# Patient Record
Sex: Female | Born: 1990 | Race: Asian | Hispanic: No | Marital: Single | Smoking: Never smoker
Health system: Southern US, Community
[De-identification: ages and names within clinical notes are randomized; demographics above are authoritative.]

## PROBLEM LIST (undated history)

## (undated) DIAGNOSIS — Z789 Other specified health status: Secondary | ICD-10-CM

## (undated) DIAGNOSIS — M2142 Flat foot [pes planus] (acquired), left foot: Secondary | ICD-10-CM

## (undated) DIAGNOSIS — M2141 Flat foot [pes planus] (acquired), right foot: Secondary | ICD-10-CM

## (undated) DIAGNOSIS — I493 Ventricular premature depolarization: Secondary | ICD-10-CM

## (undated) HISTORY — PX: NO PAST SURGERIES: SHX2092

## (undated) HISTORY — DX: Flat foot (pes planus) (acquired), right foot: M21.41

## (undated) HISTORY — DX: Flat foot (pes planus) (acquired), left foot: M21.42

---
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
## 2012-07-27 ENCOUNTER — Ambulatory Visit (INDEPENDENT_AMBULATORY_CARE_PROVIDER_SITE_OTHER): Payer: BC Managed Care – PPO | Admitting: Physician Assistant

## 2012-07-27 VITALS — BP 114/79 | HR 45 | Temp 98.1°F | Resp 16 | Ht 62.5 in | Wt 127.0 lb

## 2012-07-27 DIAGNOSIS — S86899A Other injury of other muscle(s) and tendon(s) at lower leg level, unspecified leg, initial encounter: Secondary | ICD-10-CM

## 2012-07-27 DIAGNOSIS — M2140 Flat foot [pes planus] (acquired), unspecified foot: Secondary | ICD-10-CM

## 2012-07-27 DIAGNOSIS — IMO0002 Reserved for concepts with insufficient information to code with codable children: Secondary | ICD-10-CM

## 2012-07-27 DIAGNOSIS — M25579 Pain in unspecified ankle and joints of unspecified foot: Secondary | ICD-10-CM

## 2012-07-27 MED ORDER — IBUPROFEN 600 MG PO TABS: 600.0000 mg | ORAL_TABLET | Freq: Three times a day (TID) | ORAL | Status: DC | PRN

## 2012-07-27 NOTE — Progress Notes (Signed)
   [address] [place] [address]   Phone [phone]   Subjective:    Patient ID: Anna Grant, female    DOB: 21-Jul-1990, 21 y.o.   MRN: [id number]  HPI Pt presents to clinic with 2 concerns 1- shin pain for the last 4-5 days - she works out regularly - plays volleyball and runs at [employer]  She has not changed shoes or had an injury recently.  She put some ice on her shins and it did not help much - she is not using any meds. 2- feet pain - for years.  The bottoms of her feet hurt in the arches when she walks or stands for long periods of time.  She sits mostly at work.  Her feet do not hurt when she wears heels only flat shoes.  Review of Systems  Musculoskeletal: Negative for gait problem.       Objective:   Physical Exam  Vitals reviewed. Constitutional: She is oriented to person, place, and time. She appears well-developed and well-nourished.  HENT:  Head: Normocephalic and atraumatic.  Right Ear: External ear normal.  Left Ear: External ear normal.  Eyes: Conjunctivae are normal.  Pulmonary/Chest: Effort normal.  Musculoskeletal:  No TTP of shins but patient has pain with dorsiflexion.  Pes planus B no TTP of arches.  No TTP over plantar fascia.    Neurological: She is alert and oriented to person, place, and time.  Skin: Skin is warm and dry.  Psychiatric: She has a normal mood and affect. Her behavior is normal. Judgment and thought content normal.       Assessment & Plan:  Shin splints, initial encounter - Plan: ibuprofen (ADVIL,MOTRIN) 600 MG tablet  Pain in joint, ankle and foot, unspecified laterality -   Flat feet, unspecified laterality - pt will get orthotics - I wonder if her shin splints are related to her pes planus - she will try pre-made orthotics and if her pain does not improve - we will refer for custom orthotics.  Benny Lennert PA-C 07/27/2012 5:03 PM

## 2012-07-27 NOTE — Patient Instructions (Signed)
-  Off N Running - to be fitted for orthotics -   Shin Splints Shin splints is a painful condition that is felt on the shinbone or in the muscles on either side of the bone (front of your lower leg). Shin splints happen when physical activities, such as sports or other demanding exercise, leads to inflammation of the muscles, tendons, and the thin layer that covers the shinbone.  CAUSES   Overuse of muscles.  Repetitive activities.  Flat feet or rigid arches. Activities that could contribute to shin splints include:  A sudden increase in exercise time.  Starting a new, demanding activity.  Running up hills or long distances.  Playing sports with sudden starts and stops.  A poor warm up.  Old or worn-out shoes. SYMPTOMS   Pain on the front of the leg.  Pain while exercising or at rest. DIAGNOSIS  Your caregiver will diagnose shin splints from a history of your symptoms and a physical exam. You may be observed as you walk or run. X-ray exams or further testing may be needed to rule out other problems, such as a stress fracture, which also causes lower leg pain. TREATMENT  Your caregiver may decide on the treatment based on your age, history, health, and how bad the pain is. Most cases of shin splints can be managed by one or more of the following:  Resting.  Reducing the length and intensity of your exercise.  Stopping the activity that causes shin pain.  Taking medicines to control the inflammation.  Icing, massaging, stretching, and strengthening the affected area.  Getting shoes with rigid heels, shock absorption, and a good arch support. HOME CARE INSTRUCTIONS   Resume activity steadily or as directed by your caregiver.  Restart your exercise sessions with non-weight-bearing exercises, such as cycling or swimming.  Stop running if the pain returns.  Warm up properly before exercising.  Run on a level and fairly firm surface.  Gradually change the intensity of  an exercise.  Limit increases in running distance by no more than 5 to 10% weekly. This means if you are running 5 miles, you can only increase your run by 1/2 a mile at a time.  Change your athletic shoes every 6 months, or every 350 to 450 miles. SEEK MEDICAL CARE IF:   Symptoms continue or worsen even after treatment.  The location, intensity, or type of pain changes over time. SEEK IMMEDIATE MEDICAL CARE IF:   You have severe pain.  You have trouble walking. MAKE SURE YOU:  Understand these instructions.  Will watch your condition.  Will get help right away if you are not doing well or get worse. Document Released: 03/12/2000 Document Revised: 06/07/2011 Document Reviewed: 08/30/2010 [facility] Patient Information 2013 [place], [place].

## 2012-10-05 ENCOUNTER — Ambulatory Visit (INDEPENDENT_AMBULATORY_CARE_PROVIDER_SITE_OTHER): Payer: BC Managed Care – PPO | Admitting: Internal Medicine

## 2012-10-05 ENCOUNTER — Encounter: Payer: Self-pay | Admitting: Internal Medicine

## 2012-10-05 ENCOUNTER — Ambulatory Visit: Payer: BC Managed Care – PPO

## 2012-10-05 VITALS — BP 110/72 | HR 70 | Temp 98.2°F | Resp 16 | Ht 62.5 in | Wt 130.0 lb

## 2012-10-05 DIAGNOSIS — S6991XA Unspecified injury of right wrist, hand and finger(s), initial encounter: Secondary | ICD-10-CM

## 2012-10-05 DIAGNOSIS — Z79899 Other long term (current) drug therapy: Secondary | ICD-10-CM

## 2012-10-05 DIAGNOSIS — S6980XA Other specified injuries of unspecified wrist, hand and finger(s), initial encounter: Secondary | ICD-10-CM

## 2012-10-05 DIAGNOSIS — Z8342 Family history of familial hypercholesterolemia: Secondary | ICD-10-CM

## 2012-10-05 DIAGNOSIS — Z8349 Family history of other endocrine, nutritional and metabolic diseases: Secondary | ICD-10-CM

## 2012-10-05 DIAGNOSIS — S6990XA Unspecified injury of unspecified wrist, hand and finger(s), initial encounter: Secondary | ICD-10-CM

## 2012-10-05 LAB — COMPREHENSIVE METABOLIC PANEL
Alkaline Phosphatase: 41 U/L (ref 39–117)
BUN: 16 mg/dL (ref 6–23)
Glucose, Bld: 96 mg/dL (ref 70–99)
Sodium: 139 mEq/L (ref 135–145)
Total Bilirubin: 0.5 mg/dL (ref 0.3–1.2)

## 2012-10-05 LAB — LIPID PANEL
HDL: 54 mg/dL (ref >39)
LDL Cholesterol: 87 mg/dL (ref 0–99)
Triglycerides: 147 mg/dL (ref <150)
VLDL: 29 mg/dL (ref 0–40)

## 2012-10-05 LAB — POCT CBC
Granulocyte percent: 64.2 %G (ref 37–80)
Hemoglobin: 13.8 g/dL (ref 12.2–16.2)
MCH, POC: 30.2 pg (ref 27–31.2)
POC MID %: 6.6 %M (ref 0–12)
Platelet Count, POC: 208 10*3/uL (ref 142–424)
RBC: 4.57 M/uL (ref 4.04–5.48)
WBC: 7.1 10*3/uL (ref 4.6–10.2)

## 2012-10-05 NOTE — Patient Instructions (Signed)
Hand Contusion A hand contusion is a deep bruise on your hand area. Contusions are the result of an injury that caused bleeding under the skin. The contusion may turn blue, purple, or yellow. Minor injuries will give you a painless contusion, but more severe contusions may stay painful and swollen for a few weeks. CAUSES  A contusion is usually caused by a blow, trauma, or direct force to an area of the body. SYMPTOMS   Swelling and redness of the injured area.  Discoloration of the injured area.  Tenderness and soreness of the injured area.  Pain. DIAGNOSIS  The diagnosis can be made by taking a history and performing a physical exam. An X-ray, CT scan, or MRI may be needed to determine if there were any associated injuries, such as broken bones (fractures). TREATMENT  Often, the best treatment for a hand contusion is resting, elevating, icing, and applying cold compresses to the injured area. Over-the-counter medicines may also be recommended for pain control. HOME CARE INSTRUCTIONS   Put ice on the injured area.  Put ice in a plastic bag.  Place a towel between your skin and the bag.  Leave the ice on for 15-20 minutes, 3-4 times a day.  Only take over-the-counter or prescription medicines as directed by your caregiver. Your caregiver may recommend avoiding anti-inflammatory medicines (aspirin, ibuprofen, and naproxen) for 48 hours because these medicines may increase bruising.  If told, use an elastic wrap as directed. This can help reduce swelling. You may remove the wrap for sleeping, showering, and bathing. If your fingers become numb, cold, or blue, take the wrap off and reapply it more loosely.  Elevate your hand with pillows to reduce swelling.  Avoid overusing your hand if it is painful. SEEK IMMEDIATE MEDICAL CARE IF:   You have increased redness, swelling, or pain in your hand.  Your swelling or pain is not relieved with medicines.  You have loss of feeling in  your hand or are unable to move your fingers.  Your hand turns cold or blue.  You have pain when you move your fingers.  Your hand becomes warm to the touch.  Your contusion does not improve in 2 days. MAKE SURE YOU:   Understand these instructions.  Will watch your condition.  Will get help right away if you are not doing well or get worse. Document Released: 09/04/2001 Document Revised: 12/08/2011 Document Reviewed: 09/06/2011 [facility] Patient Information 2014 [place], [place]. Wound Care Wound care helps prevent pain and infection.  You may need a tetanus shot if:  You cannot remember when you had your last tetanus shot.  You have never had a tetanus shot.  The injury broke your skin. If you need a tetanus shot and you choose not to have one, you may get tetanus. Sickness from tetanus can be serious. HOME CARE   Only take medicine as told by your doctor.  Clean the wound daily with mild soap and water.  Change any bandages (dressings) as told by your doctor.  Put medicated cream and a bandage on the wound as told by your doctor.  Change the bandage if it gets wet, dirty, or starts to smell.  Take showers. Do not take baths, swim, or do anything that puts your wound under water.  Rest and raise (elevate) the wound until the pain and puffiness (swelling) are better.  Keep all doctor visits as told. GET HELP RIGHT AWAY IF:   Yellowish-white fluid (pus) comes from the wound.  Medicine does not lessen your pain.  There is a red streak going away from the wound.  You have a fever. MAKE SURE YOU:   Understand these instructions.  Will watch your condition.  Will get help right away if you are not doing well or get worse. Document Released: 12/23/2007 Document Revised: 06/07/2011 Document Reviewed: 07/19/2010 [facility] Patient Information 2014 [place], [place].

## 2012-10-05 NOTE — Progress Notes (Signed)
  Subjective:    Patient ID: Anna Grant, female    DOB: 1990-05-31, 22 y.o.   MRN: [id number]  HPI Slammed own car door on her rightt thumb, does not want to remove her nail polish-refuses. Thumb bruised and bleeding from base of nail. Polish thick white acrylic. Has full rom of IPJ Worried about her health and requests lab work, agrees to do CPE later.  Review of Systems fhx of cholesterol    Objective:   Physical Exam  Musculoskeletal: She exhibits edema and tenderness.       Right hand: She exhibits decreased range of motion, tenderness, bony tenderness and swelling. She exhibits normal two-point discrimination. Normal sensation noted. Normal strength noted. She exhibits no finger abduction and no thumb/finger opposition.       Hands: Bleeding and eccymotic   [facility] reading (PRIMARY) by  Dr Perrin Maltese no fx seen         Assessment & Plan:  Crush thumb/No fx seen Worried about health-labs and cpe soon Application of thumb spica splint Reck 2d

## 2013-05-15 ENCOUNTER — Ambulatory Visit: Payer: Self-pay | Admitting: Physician Assistant

## 2013-05-15 VITALS — BP 110/54 | HR 56 | Temp 99.1°F | Resp 18 | Ht 63.0 in | Wt 132.0 lb

## 2013-05-15 DIAGNOSIS — N949 Unspecified condition associated with female genital organs and menstrual cycle: Secondary | ICD-10-CM

## 2013-05-15 DIAGNOSIS — N938 Other specified abnormal uterine and vaginal bleeding: Secondary | ICD-10-CM

## 2013-05-15 DIAGNOSIS — N926 Irregular menstruation, unspecified: Secondary | ICD-10-CM

## 2013-05-15 LAB — POCT URINE PREGNANCY: PREG TEST UR: POSITIVE

## 2013-05-15 NOTE — Progress Notes (Signed)
   Subjective:    Patient ID: Anna Grant, female    DOB: 12-21-1990, 23 y.o.   MRN: [id number]  HPI   Anna Grant is a pleasant 23 yr old female here with concern for pregnancy.  She had a positive home pregnancy test today.  LMP was early [place] - thinks 4th, 5th, or 6th.  Reports that her periods have always been irregular.  She was not using contraception. States she was not having intercourse prior to the end of January, about 4 wks ago.  She was not trying to conceive.  After she had the positive test this AM she began to feel some breast tenderness and mild lower abd cramping.  She denies abd pain.  She denies bleeding or spotting.  She is not planning to continue the pregnancy.  She would like to undergo medical ab as soon as possible.     Review of Systems  Constitutional: Negative for fever and chills.  HENT: Negative.   Respiratory: Negative.   Cardiovascular: Negative.   Gastrointestinal: Negative for nausea, vomiting and abdominal pain.  Genitourinary: Negative for vaginal bleeding.  Musculoskeletal: Negative.   Skin: Negative.        Objective:   Physical Exam  Vitals reviewed. Constitutional: She is oriented to person, place, and time. She appears well-developed and well-nourished. No distress.  HENT:  Head: Normocephalic and atraumatic.  Eyes: Conjunctivae are normal. No scleral icterus.  Cardiovascular: Regular rhythm and normal heart sounds.  Bradycardia present.   Pulmonary/Chest: Effort normal and breath sounds normal. She has no wheezes. She has no rales.  Abdominal: Soft. There is no tenderness.  Neurological: She is alert and oriented to person, place, and time.  Skin: Skin is warm and dry.  Psychiatric: She has a normal mood and affect. Her behavior is normal.   Results for orders placed in visit on 05/15/13  POCT URINE PREGNANCY      Result Value Ref Range   Preg Test, Ur Positive         Assessment & Plan:  Menstrual period late - Plan: POCT urine  pregnancy   Anna Grant is a 23 yr old female here with amenorrhea and +pregnancy test.  With irregular periods, difficult to determine dates.  Pt reports she was not having intercourse prior to 4 wks ago.  She does not wish to continue with the pregnancy, and  we discussed her options for this.  Pt would like to have a medical abortion as soon as possible.  I explained that unfortunately we are not able to provide that service here.  I have given her contact information for several clinics locally that do provide these services.  It is possible that some OB/GYN practices in the area provide this as well but I do not know for certain, which I have also explained to the patient.  Her questions were answered during the visit.  She is welcome to call if she has further questions.  We briefly discussed the importance of contraception going forward to avoid unwanted pregnancy.  Pt understands and agrees.   Anna Grant MHS, PA-C [facility] 2/17/20156:12 PM

## 2014-02-05 ENCOUNTER — Ambulatory Visit (INDEPENDENT_AMBULATORY_CARE_PROVIDER_SITE_OTHER): Payer: No Typology Code available for payment source | Admitting: Family Medicine

## 2014-02-05 VITALS — BP 110/70 | HR 64 | Temp 98.2°F | Resp 16 | Ht 62.5 in | Wt 138.6 lb

## 2014-02-05 DIAGNOSIS — Z114 Encounter for screening for human immunodeficiency virus [HIV]: Secondary | ICD-10-CM

## 2014-02-05 DIAGNOSIS — Z113 Encounter for screening for infections with a predominantly sexual mode of transmission: Secondary | ICD-10-CM

## 2014-02-05 DIAGNOSIS — Z13220 Encounter for screening for lipoid disorders: Secondary | ICD-10-CM

## 2014-02-05 DIAGNOSIS — Z0000 Encounter for general adult medical examination without abnormal findings: Secondary | ICD-10-CM

## 2014-02-05 DIAGNOSIS — Z23 Encounter for immunization: Secondary | ICD-10-CM

## 2014-02-05 DIAGNOSIS — Z131 Encounter for screening for diabetes mellitus: Secondary | ICD-10-CM

## 2014-02-05 DIAGNOSIS — Z1151 Encounter for screening for human papillomavirus (HPV): Secondary | ICD-10-CM

## 2014-02-05 DIAGNOSIS — Z1329 Encounter for screening for other suspected endocrine disorder: Secondary | ICD-10-CM

## 2014-02-05 LAB — POCT UA - MICROSCOPIC ONLY
BACTERIA, U MICROSCOPIC: NEGATIVE
CASTS, UR, LPF, POC: NEGATIVE
CRYSTALS, UR, HPF, POC: NEGATIVE
MUCUS UA: NEGATIVE
YEAST UA: NEGATIVE

## 2014-02-05 LAB — POCT URINALYSIS DIPSTICK
BILIRUBIN UA: NEGATIVE
Glucose, UA: NEGATIVE
KETONES UA: NEGATIVE
Nitrite, UA: NEGATIVE
PH UA: 7
PROTEIN UA: NEGATIVE
Spec Grav, UA: 1.02
Urobilinogen, UA: 0.2

## 2014-02-05 NOTE — Progress Notes (Signed)
IDENTIFYING INFORMATION  Anna Grant / DOB: [birth year] / MRN: [id number]  The patient does not have any chronic medical conditions.    SUBJECTIVE   Chief Complaint: Annual Exam   History of present illness: Anna Grant is a 23 y.o. year old female who presents for an annual physical. She is very concerned about a 20 lbs weight gain that began in February of this year, after a 15 lbs weight loss.  She reports feeling dysthymic roughly two days a weeks, and denies anhedonia. She is not sexually active at this time, and reports that she has not had sex in over a year. She would like her cholesterol checked today, and would like to be screened for diabetes.   She has a past medical history of Pes planus of both feet. The patient has a current medication list which includes the following prescription(s): ibuprofen.   Anna Grant has No Known Allergies. She reports that she has never smoked. She is a never smoker. She reports that she does not drink alcohol or use illicit drugs. She reports that she does not engage in sexual activity. The patient has no past surgeries.    Review of Systems  Constitutional: Negative.   Eyes: Negative.   Respiratory: Negative.   Cardiovascular: Negative.   Gastrointestinal: Negative.   Genitourinary: Negative.   Musculoskeletal: Positive for back pain and joint pain.  Skin: Negative.   Neurological: Positive for headaches. Negative for dizziness, tingling, tremors, sensory change, speech change, focal weakness, seizures and loss of consciousness.  Psychiatric/Behavioral: Positive for depression.     OBJECTIVE  Blood pressure 110/70, pulse 64, temperature 98.2 F (36.8 C), temperature source Oral, resp. rate 16, height 5' 2.5" (1.588 m), weight 138 lb 9.6 oz (62.869 kg), last menstrual period 02/01/2014, SpO2 100 %. The patient's body mass index is 24.93 kg/(m^2).   Physical Exam  Constitutional: She is oriented to person, place, and time and well-developed,  well-nourished, and in no distress.  HENT:  Head: Normocephalic.  Right Ear: External ear normal.  Left Ear: External ear normal.  Nose: Nose normal.  Mouth/Throat: Oropharynx is clear and moist. No oropharyngeal exudate.  Eyes: Conjunctivae and EOM are normal. Pupils are equal, round, and reactive to light. Right eye exhibits no discharge. Left eye exhibits no discharge. No scleral icterus.  Neck: Normal range of motion. Neck supple.  Cardiovascular: Normal rate, regular rhythm, normal heart sounds and intact distal pulses.   Pulmonary/Chest: Effort normal and breath sounds normal.  Abdominal: Soft. Bowel sounds are normal. She exhibits no distension and no mass. There is no tenderness. There is no rebound and no guarding.  Neurological: She is alert and oriented to person, place, and time. No cranial nerve deficit. Gait normal. Coordination normal.  Skin: Skin is warm and dry. She is not diaphoretic.  Psychiatric: Mood, memory, affect and judgment normal.     Results for orders placed or performed in visit on 02/05/14  POCT urinalysis dipstick  Result Value Ref Range   Color, UA yellow    Clarity, UA clear    Glucose, UA neg    Bilirubin, UA neg    Ketones, UA neg    Spec Grav, UA 1.020    Blood, UA tr-intact    pH, UA 7.0    Protein, UA neg    Urobilinogen, UA 0.2    Nitrite, UA neg    Leukocytes, UA moderate (2+)   POCT UA - Microscopic Only  Result Value Ref Range  WBC, Ur, HPF, POC 1-5    RBC, urine, microscopic 0-1    Bacteria, U Microscopic neg    Mucus, UA neg    Epithelial cells, urine per micros 0-4    Crystals, Ur, HPF, POC neg    Casts, Ur, LPF, POC neg    Yeast, UA neg     ASSESSMENT & PLAN  Anna Grant was seen today for annual exam.   Diagnoses and associated orders for this visit:  Physical exam  - CBC with Differential  - Comprehensive metabolic panel  - POCT urinalysis dipstick  - POCT UA - Microscopic Only  Thyroid disorder screen  - TSH  Diabetes  mellitus screening  - Hemoglobin A1c  Routine screening for STI (sexually transmitted infection)  - GC/Chlamydia Probe Amp  - Hepatitis C antibody  - Hepatitis B surface antibody  - Hepatitis B surface antigen  - RPR  Screening for HIV (human immunodeficiency virus)  - HIV antibody  Screening for HPV - Pap test with GC/Chlamydia   The patient was instructed to to call or comeback to clinic as needed, or should symptoms warrant.  Deliah BostonMichael Clark, MHS, PA-C  [facility]  02/05/2014 7:19 PM

## 2014-02-06 LAB — CBC WITH DIFFERENTIAL/PLATELET
BASOS PCT: 1 % (ref 0–1)
Basophils Absolute: 0.1 10*3/uL (ref 0.0–0.1)
Eosinophils Absolute: 0.1 10*3/uL (ref 0.0–0.7)
Eosinophils Relative: 2 % (ref 0–5)
HEMATOCRIT: 39.3 % (ref 36.0–46.0)
HEMOGLOBIN: 13.2 g/dL (ref 12.0–15.0)
LYMPHS ABS: 2.1 10*3/uL (ref 0.7–4.0)
LYMPHS PCT: 33 % (ref 12–46)
MCH: 30.4 pg (ref 26.0–34.0)
MCHC: 33.6 g/dL (ref 30.0–36.0)
MCV: 90.6 fL (ref 78.0–100.0)
MONO ABS: 0.6 10*3/uL (ref 0.1–1.0)
MONOS PCT: 9 % (ref 3–12)
NEUTROS ABS: 3.5 10*3/uL (ref 1.7–7.7)
NEUTROS PCT: 55 % (ref 43–77)
Platelets: 221 10*3/uL (ref 150–400)
RBC: 4.34 MIL/uL (ref 3.87–5.11)
RDW: 12.9 % (ref 11.5–15.5)
WBC: 6.3 10*3/uL (ref 4.0–10.5)

## 2014-02-06 LAB — LIPID PANEL
CHOL/HDL RATIO: 3.3 ratio
CHOLESTEROL: 182 mg/dL (ref 0–200)
HDL: 55 mg/dL (ref >39)
LDL Cholesterol: 107 mg/dL — ABNORMAL HIGH (ref 0–99)
TRIGLYCERIDES: 99 mg/dL (ref <150)
VLDL: 20 mg/dL (ref 0–40)

## 2014-02-06 LAB — COMPREHENSIVE METABOLIC PANEL
ALBUMIN: 4.6 g/dL (ref 3.5–5.2)
ALT: 28 U/L (ref 0–35)
AST: 20 U/L (ref 0–37)
Alkaline Phosphatase: 43 U/L (ref 39–117)
BILIRUBIN TOTAL: 0.4 mg/dL (ref 0.2–1.2)
BUN: 15 mg/dL (ref 6–23)
CO2: 26 meq/L (ref 19–32)
Calcium: 9.5 mg/dL (ref 8.4–10.5)
Chloride: 102 mEq/L (ref 96–112)
Creat: 0.68 mg/dL (ref 0.50–1.10)
GLUCOSE: 90 mg/dL (ref 70–99)
POTASSIUM: 4.3 meq/L (ref 3.5–5.3)
SODIUM: 137 meq/L (ref 135–145)
TOTAL PROTEIN: 7.6 g/dL (ref 6.0–8.3)

## 2014-02-06 LAB — HEMOGLOBIN A1C
Hgb A1c MFr Bld: 5.3 % (ref <5.7)
Mean Plasma Glucose: 105 mg/dL (ref <117)

## 2014-02-06 LAB — HIV ANTIBODY (ROUTINE TESTING W REFLEX): HIV: NONREACTIVE

## 2014-02-06 LAB — RPR

## 2014-02-06 LAB — HEPATITIS B SURFACE ANTIBODY, QUANTITATIVE: Hepatitis B-Post: 1.1 m[IU]/mL

## 2014-02-06 LAB — HEPATITIS B SURFACE ANTIGEN: Hepatitis B Surface Ag: NEGATIVE

## 2014-02-06 LAB — HEPATITIS C ANTIBODY: HCV Ab: NEGATIVE

## 2014-02-06 LAB — TSH: TSH: 3.964 u[IU]/mL (ref 0.350–4.500)

## 2014-02-06 NOTE — Progress Notes (Signed)
D/w Mr Coralie Common. Agree with A/P. Dr Conley Rolls

## 2014-02-07 LAB — PAP IG, CT-NG, RFX HPV ASCU
CHLAMYDIA PROBE AMP: NEGATIVE
GC PROBE AMP: NEGATIVE

## 2014-02-08 ENCOUNTER — Encounter: Payer: Self-pay | Admitting: Physician Assistant

## 2014-11-04 ENCOUNTER — Telehealth: Payer: Self-pay

## 2014-11-04 NOTE — Telephone Encounter (Signed)
Patient called in MR voicemail on Friday 11/01/14 at 4:16pm requesting her immunization records for school, patient will need to fill out an ROI before we can process this. Patients call back number is [phone].

## 2014-11-06 NOTE — Telephone Encounter (Signed)
Called patient and left message on phone telling her that she will need to fill out ROI before we can process this request, let her know she could come in and fill one out or get it offline and fax it in if that was easier. Will wait for ROI before processing.

## 2014-11-14 NOTE — Telephone Encounter (Signed)
Did not see a ROI form for this patient as of today. Not sure if request was processed because encounter has been closed.

## 2015-03-07 IMAGING — CR DG FINGER THUMB 2+V*R*
1 series · 1 of 1 positions shown · non-contrast
Comparison: None.

CLINICAL DATA: Thumb injury

RIGHT THUMB 2+V

[PA]
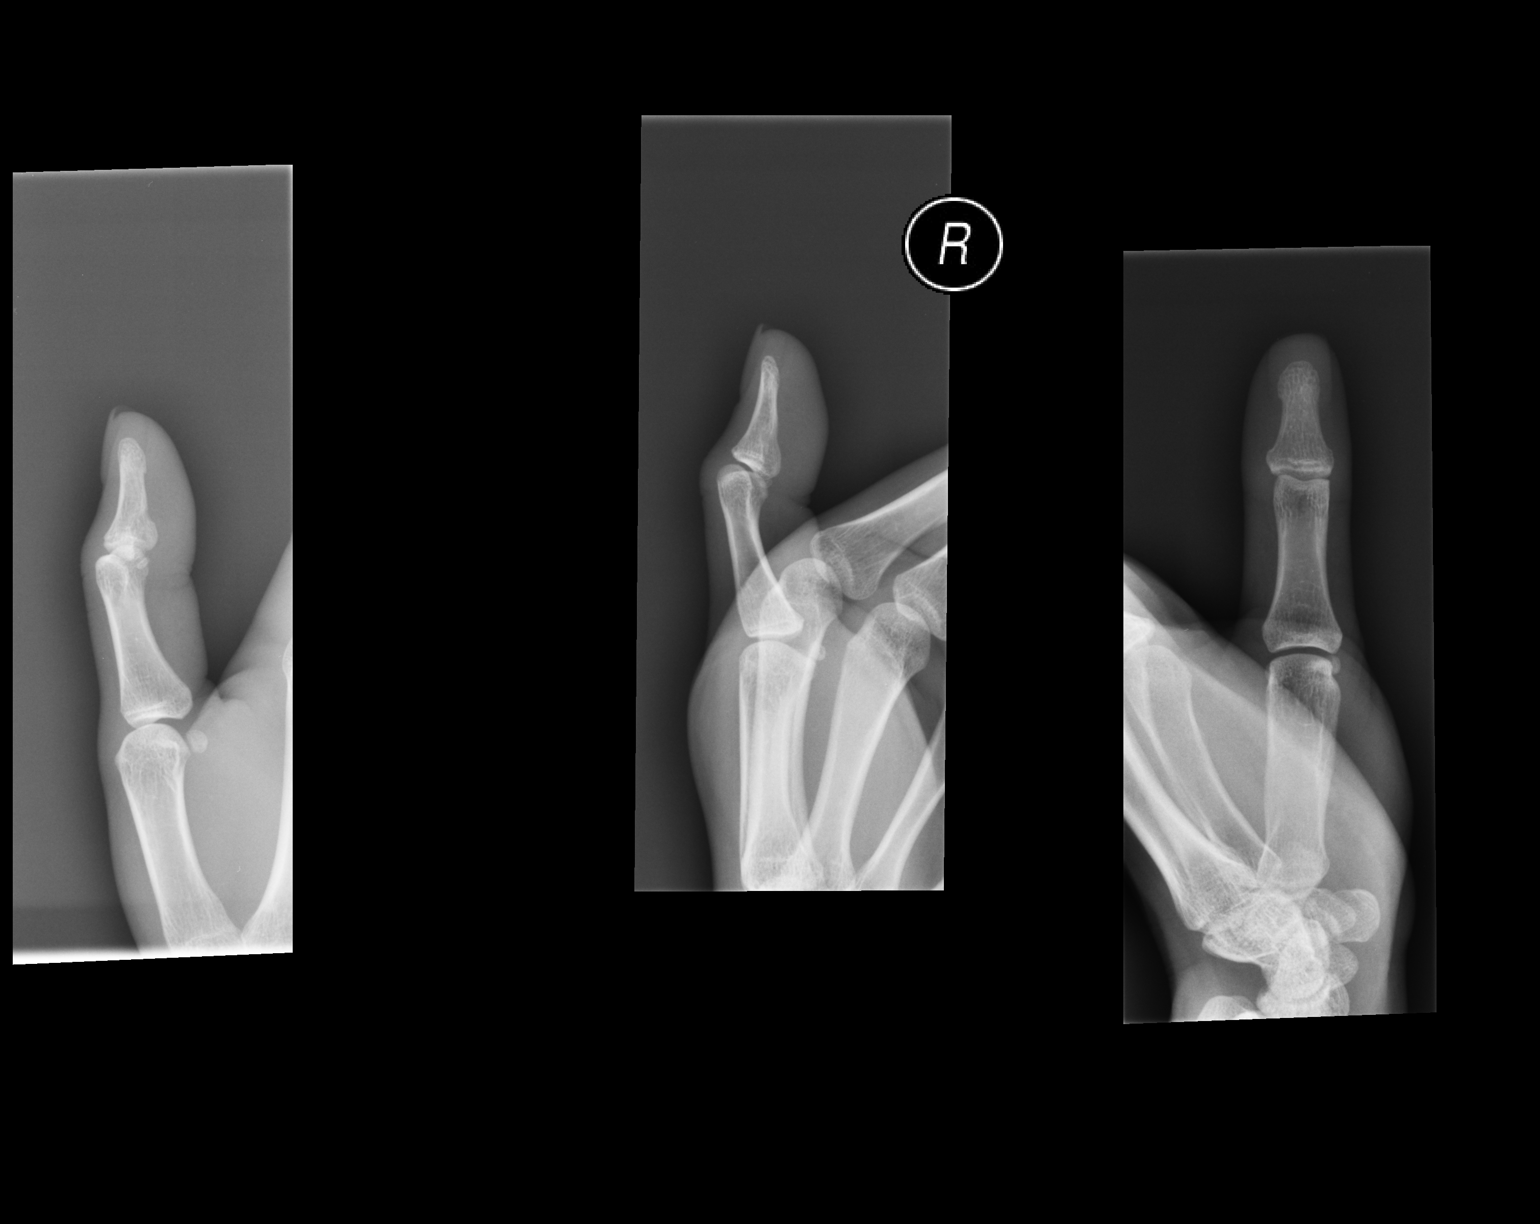

[1 of 1 positions shown; findings below may reference images not displayed]

FINDINGS: No fracture or dislocation is seen.

The joint spaces are preserved.

The visualized soft tissues are unremarkable.
IMPRESSION: No fracture or dislocation is seen.

Clinically significant discrepancy from primary report, if
provided: None

## 2016-02-12 ENCOUNTER — Ambulatory Visit (INDEPENDENT_AMBULATORY_CARE_PROVIDER_SITE_OTHER): Payer: BLUE CROSS/BLUE SHIELD | Admitting: Physician Assistant

## 2016-02-12 VITALS — BP 108/66 | HR 73 | Temp 98.2°F | Resp 17 | Ht 62.5 in | Wt 109.0 lb

## 2016-02-12 DIAGNOSIS — R946 Abnormal results of thyroid function studies: Secondary | ICD-10-CM

## 2016-02-12 DIAGNOSIS — R350 Frequency of micturition: Secondary | ICD-10-CM

## 2016-02-12 DIAGNOSIS — R7989 Other specified abnormal findings of blood chemistry: Secondary | ICD-10-CM

## 2016-02-12 DIAGNOSIS — Z3A01 Less than 8 weeks gestation of pregnancy: Secondary | ICD-10-CM | POA: Diagnosis not present

## 2016-02-12 LAB — POCT URINALYSIS DIP (MANUAL ENTRY)
GLUCOSE UA: NEGATIVE
Leukocytes, UA: NEGATIVE
NITRITE UA: NEGATIVE
Protein Ur, POC: NEGATIVE
Spec Grav, UA: 1.025
UROBILINOGEN UA: 0.2
pH, UA: 5.5

## 2016-02-12 LAB — TSH: TSH: 2.51 mIU/L

## 2016-02-12 LAB — POC MICROSCOPIC URINALYSIS (UMFC): MUCUS RE: ABSENT

## 2016-02-12 LAB — POCT URINE PREGNANCY: Preg Test, Ur: POSITIVE — AB

## 2016-02-12 LAB — T4, FREE: Free T4: 1.4 ng/dL (ref 0.8–1.8)

## 2016-02-12 NOTE — Progress Notes (Signed)
   Anna Grant  MRN: [id number] DOB: 10/13/1990  Subjective:  Pt presents to clinic with lower abd cramping for about 2 weeks - it is worse at night and in the am - Increased urinary frequency urgency but no dysuria.  No more vaginal discharge than normal.  She does not typically have cramping with menses or with ovulation -this is a different type of pain.  She has never had an ovarian cyst that she knows of.  LMP - 10/23    Review of Systems  Constitutional: Negative for chills and fever.  Gastrointestinal: Positive for abdominal pain. Negative for nausea and vomiting.  Genitourinary: Positive for frequency, urgency and vaginal discharge. Negative for dysuria, hematuria, menstrual problem and vaginal bleeding.    There are no active problems to display for this patient.   No current outpatient prescriptions on file prior to visit.   No current facility-administered medications on file prior to visit.     No Known Allergies  Pt patients past, family and social history were reviewed and updated.   Objective:  BP 108/66 (BP Location: Left Arm, Patient Position: Sitting, Cuff Size: Normal)   Pulse 73   Temp 98.2 F (36.8 C) (Oral)   Resp 17   Ht 5' 2.5" (1.588 m)   Wt 109 lb (49.4 kg)   LMP 01/19/2016 (Approximate)   SpO2 99%   BMI 19.62 kg/m   Physical Exam  Constitutional: She is oriented to person, place, and time and well-developed, well-nourished, and in no distress.  HENT:  Head: Normocephalic and atraumatic.  Right Ear: Hearing and external ear normal.  Left Ear: Hearing and external ear normal.  Eyes: Conjunctivae are normal.  Neck: Normal range of motion.  Cardiovascular: Normal rate, regular rhythm and normal heart sounds.   No murmur heard. Pulmonary/Chest: Effort normal and breath sounds normal. She has no wheezes.  Neurological: She is alert and oriented to person, place, and time. Gait normal.  Skin: Skin is warm and dry.  Psychiatric: Mood, memory,  affect and judgment normal.  Vitals reviewed.   Results for orders placed or performed in visit on 02/12/16  POCT urinalysis dipstick  Result Value Ref Range   Color, UA yellow yellow   Clarity, UA clear clear   Glucose, UA negative negative   Bilirubin, UA small (A) negative   Ketones, POC UA trace (5) (A) negative   Spec Grav, UA 1.025    Blood, UA large (A) negative   pH, UA 5.5    Protein Ur, POC negative negative   Urobilinogen, UA 0.2    Nitrite, UA Negative Negative   Leukocytes, UA Negative Negative  POCT Microscopic Urinalysis ([facility])  Result Value Ref Range   WBC,UR,HPF,POC Few (A) None WBC/hpf   RBC,UR,HPF,POC None None RBC/hpf   Bacteria Few (A) None, Too numerous to count   Mucus Absent Absent   Epithelial Cells, UR Per Microscopy Many (A) None, Too numerous to count cells/hpf  POCT urine pregnancy  Result Value Ref Range   Preg Test, Ur Positive (A) Negative    Assessment and Plan :  Urinary frequency - Plan: POCT urinalysis dipstick, POCT Microscopic Urinalysis ([facility]), POCT urine pregnancy  Abnormal TSH - Plan: TSH, T4, Free - she has been told this needs to be checked again -   Less than [redacted] weeks gestation of pregnancy - continue prenatal vitamins -    Benny Lennert PA-C  [facility] 02/12/2016 4:00 PM

## 2016-02-12 NOTE — Patient Instructions (Addendum)
3 weeks 9 days Due October 22 2016    IF you received an x-ray today, you will receive an invoice from [facility]. Please contact [facility] at [phone] with questions or concerns regarding your invoice.   IF you received labwork today, you will receive an invoice from [employer]. Please contact [facility] at [phone] with questions or concerns regarding your invoice.   Our billing staff will not be able to assist you with questions regarding bills from these companies.  You will be contacted with the lab results as soon as they are available. The fastest way to get your results is to activate your My Chart account. Instructions are located on the last page of this paperwork. If you have not heard from [place] regarding the results in 2 weeks, please contact this office.

## 2020-03-29 NOTE — L&D Delivery Note (Signed)
Delivery Note At 10:27 AM a viable and healthy female was delivered via Vaginal, Spontaneous (Presentation:   Occiput Anterior).  APGAR: 8, 9; weight pending .  Precipitous unmedicated delivery. Placenta status: Spontaneous, Intact.  Cord: 3 vessels with the following complications: None.  Cord pH: n/a  Anesthesia: None Episiotomy:  none Lacerations:  right periurethral; due to proximity to urethra, a straight cath was inserted and found to be intact and remained in place during repair Suture Repair:  4.0 Vicryl Est. Blood Loss (mL):  50  Mom to postpartum.  Baby to Couplet care / Skin to Skin.  Larry Knipp A Arvell Pulsifer 10/08/2020, 10:53 AM

## 2020-09-24 LAB — OB RESULTS CONSOLE GBS: GBS: NEGATIVE

## 2020-10-08 ENCOUNTER — Encounter (HOSPITAL_COMMUNITY): Payer: Self-pay | Admitting: *Deleted

## 2020-10-08 ENCOUNTER — Inpatient Hospital Stay (HOSPITAL_COMMUNITY)
Admission: AD | Admit: 2020-10-08 | Discharge: 2020-10-11 | DRG: 805 | Disposition: A | Discharge status: Home / Self Care | Payer: 59 | Attending: Obstetrics and Gynecology | Admitting: Obstetrics and Gynecology

## 2020-10-08 ENCOUNTER — Other Ambulatory Visit: Payer: Self-pay

## 2020-10-08 DIAGNOSIS — O9942 Diseases of the circulatory system complicating childbirth: Secondary | ICD-10-CM | POA: Diagnosis present

## 2020-10-08 DIAGNOSIS — I517 Cardiomegaly: Secondary | ICD-10-CM | POA: Diagnosis present

## 2020-10-08 DIAGNOSIS — O99892 Other specified diseases and conditions complicating childbirth: Secondary | ICD-10-CM | POA: Diagnosis present

## 2020-10-08 DIAGNOSIS — Z3A39 39 weeks gestation of pregnancy: Secondary | ICD-10-CM | POA: Diagnosis not present

## 2020-10-08 DIAGNOSIS — U071 COVID-19: Secondary | ICD-10-CM | POA: Diagnosis present

## 2020-10-08 DIAGNOSIS — I498 Other specified cardiac arrhythmias: Secondary | ICD-10-CM | POA: Diagnosis not present

## 2020-10-08 DIAGNOSIS — R9431 Abnormal electrocardiogram [ECG] [EKG]: Secondary | ICD-10-CM | POA: Diagnosis present

## 2020-10-08 DIAGNOSIS — O479 False labor, unspecified: Secondary | ICD-10-CM | POA: Diagnosis present

## 2020-10-08 DIAGNOSIS — O26893 Other specified pregnancy related conditions, third trimester: Secondary | ICD-10-CM | POA: Diagnosis present

## 2020-10-08 DIAGNOSIS — R001 Bradycardia, unspecified: Secondary | ICD-10-CM | POA: Diagnosis present

## 2020-10-08 DIAGNOSIS — O9902 Anemia complicating childbirth: Secondary | ICD-10-CM | POA: Diagnosis present

## 2020-10-08 DIAGNOSIS — O9852 Other viral diseases complicating childbirth: Secondary | ICD-10-CM | POA: Diagnosis present

## 2020-10-08 DIAGNOSIS — I493 Ventricular premature depolarization: Secondary | ICD-10-CM | POA: Diagnosis not present

## 2020-10-08 DIAGNOSIS — O1404 Mild to moderate pre-eclampsia, complicating childbirth: Secondary | ICD-10-CM | POA: Diagnosis present

## 2020-10-08 DIAGNOSIS — O139 Gestational [pregnancy-induced] hypertension without significant proteinuria, unspecified trimester: Secondary | ICD-10-CM | POA: Diagnosis present

## 2020-10-08 DIAGNOSIS — I428 Other cardiomyopathies: Secondary | ICD-10-CM | POA: Diagnosis not present

## 2020-10-08 HISTORY — DX: Ventricular premature depolarization: I49.3

## 2020-10-08 HISTORY — DX: Other specified health status: Z78.9

## 2020-10-08 LAB — COMPREHENSIVE METABOLIC PANEL
ALT: 30 U/L (ref 0–44)
AST: 41 U/L (ref 15–41)
Albumin: 2.8 g/dL — ABNORMAL LOW (ref 3.5–5.0)
Alkaline Phosphatase: 167 U/L — ABNORMAL HIGH (ref 38–126)
Anion gap: 9 (ref 5–15)
BUN: 11 mg/dL (ref 6–20)
CO2: 20 mmol/L — ABNORMAL LOW (ref 22–32)
Calcium: 8.4 mg/dL — ABNORMAL LOW (ref 8.9–10.3)
Chloride: 105 mmol/L (ref 98–111)
Creatinine, Ser: 0.74 mg/dL (ref 0.44–1.00)
GFR, Estimated: >60 mL/min (ref >60)
Glucose, Bld: 78 mg/dL (ref 70–99)
Potassium: 4.2 mmol/L (ref 3.5–5.1)
Sodium: 134 mmol/L — ABNORMAL LOW (ref 135–145)
Total Bilirubin: 0.5 mg/dL (ref 0.3–1.2)
Total Protein: 5.6 g/dL — ABNORMAL LOW (ref 6.5–8.1)

## 2020-10-08 LAB — RPR: RPR Ser Ql: NONREACTIVE

## 2020-10-08 LAB — TYPE AND SCREEN
ABO/RH(D): A POS
Antibody Screen: NEGATIVE

## 2020-10-08 LAB — CBC
HCT: 36.9 % (ref 36.0–46.0)
Hemoglobin: 12.1 g/dL (ref 12.0–15.0)
MCH: 30.6 pg (ref 26.0–34.0)
MCHC: 32.8 g/dL (ref 30.0–36.0)
MCV: 93.4 fL (ref 80.0–100.0)
Platelets: 138 10*3/uL — ABNORMAL LOW (ref 150–400)
RBC: 3.95 MIL/uL (ref 3.87–5.11)
RDW: 13.1 % (ref 11.5–15.5)
WBC: 10 10*3/uL (ref 4.0–10.5)
nRBC: 0 % (ref 0.0–0.2)

## 2020-10-08 LAB — OB RESULTS CONSOLE RUBELLA ANTIBODY, IGM: Rubella: IMMUNE

## 2020-10-08 LAB — OB RESULTS CONSOLE HEPATITIS B SURFACE ANTIGEN: Hepatitis B Surface Ag: NEGATIVE

## 2020-10-08 LAB — PROTEIN / CREATININE RATIO, URINE
Creatinine, Urine: 68.02 mg/dL
Protein Creatinine Ratio: 0.38 mg/mg{Cre} — ABNORMAL HIGH (ref 0.00–0.15)
Total Protein, Urine: 26 mg/dL

## 2020-10-08 LAB — RESP PANEL BY RT-PCR (FLU A&B, COVID) ARPGX2
Influenza A by PCR: NEGATIVE
Influenza B by PCR: NEGATIVE
SARS Coronavirus 2 by RT PCR: POSITIVE — AB

## 2020-10-08 LAB — OB RESULTS CONSOLE HIV ANTIBODY (ROUTINE TESTING): HIV: NONREACTIVE

## 2020-10-08 MED ORDER — EPHEDRINE 5 MG/ML INJ: 10.0000 mg | INTRAVENOUS | Status: DC | PRN

## 2020-10-08 MED ORDER — OXYTOCIN BOLUS FROM INFUSION
333.0000 mL | Freq: Once | INTRAVENOUS | Status: AC
  Administered 2020-10-08: 333 mL via INTRAVENOUS

## 2020-10-08 MED ORDER — ONDANSETRON HCL 4 MG/2ML IJ SOLN: 4.0000 mg | Freq: Four times a day (QID) | INTRAMUSCULAR | Status: DC | PRN

## 2020-10-08 MED ORDER — PHENYLEPHRINE 40 MCG/ML (10ML) SYRINGE FOR IV PUSH (FOR BLOOD PRESSURE SUPPORT): 80.0000 ug | PREFILLED_SYRINGE | INTRAVENOUS | Status: DC | PRN

## 2020-10-08 MED ORDER — PRENATAL MULTIVITAMIN CH
1.0000 | ORAL_TABLET | Freq: Every day | ORAL | Status: DC
  Administered 2020-10-09 – 2020-10-11 (×3): 1 via ORAL
  Filled 2020-10-08 (×3): qty 1

## 2020-10-08 MED ORDER — LABETALOL HCL 5 MG/ML IV SOLN: 80.0000 mg | INTRAVENOUS | Status: DC | PRN

## 2020-10-08 MED ORDER — LACTATED RINGERS IV SOLN
500.0000 mL | Freq: Once | INTRAVENOUS | Status: AC
  Administered 2020-10-08: 500 mL via INTRAVENOUS

## 2020-10-08 MED ORDER — LABETALOL HCL 5 MG/ML IV SOLN: 40.0000 mg | INTRAVENOUS | Status: DC | PRN

## 2020-10-08 MED ORDER — ACETAMINOPHEN 325 MG PO TABS
650.0000 mg | ORAL_TABLET | ORAL | Status: DC | PRN
  Administered 2020-10-08: 650 mg via ORAL
  Filled 2020-10-08: qty 2

## 2020-10-08 MED ORDER — LIDOCAINE HCL (PF) 1 % IJ SOLN
30.0000 mL | INTRAMUSCULAR | Status: AC | PRN
  Administered 2020-10-08: 30 mL via SUBCUTANEOUS
  Filled 2020-10-08: qty 30

## 2020-10-08 MED ORDER — DIBUCAINE (PERIANAL) 1 % EX OINT: 1.0000 "application " | TOPICAL_OINTMENT | CUTANEOUS | Status: DC | PRN

## 2020-10-08 MED ORDER — HYDRALAZINE HCL 20 MG/ML IJ SOLN: 10.0000 mg | Freq: Once | INTRAMUSCULAR | Status: DC

## 2020-10-08 MED ORDER — ONDANSETRON HCL 4 MG/2ML IJ SOLN: 4.0000 mg | INTRAMUSCULAR | Status: DC | PRN

## 2020-10-08 MED ORDER — IBUPROFEN 600 MG PO TABS
600.0000 mg | ORAL_TABLET | Freq: Four times a day (QID) | ORAL | Status: DC
  Administered 2020-10-08 – 2020-10-11 (×11): 600 mg via ORAL
  Filled 2020-10-08 (×12): qty 1

## 2020-10-08 MED ORDER — BENZOCAINE-MENTHOL 20-0.5 % EX AERO
1.0000 "application " | INHALATION_SPRAY | CUTANEOUS | Status: DC | PRN
  Administered 2020-10-09: 1 via TOPICAL
  Filled 2020-10-08: qty 56

## 2020-10-08 MED ORDER — SIMETHICONE 80 MG PO CHEW: 80.0000 mg | CHEWABLE_TABLET | ORAL | Status: DC | PRN

## 2020-10-08 MED ORDER — HYDRALAZINE HCL 50 MG PO TABS
50.0000 mg | ORAL_TABLET | Freq: Two times a day (BID) | ORAL | Status: DC
  Administered 2020-10-08 – 2020-10-11 (×6): 50 mg via ORAL
  Filled 2020-10-08 (×6): qty 1

## 2020-10-08 MED ORDER — LACTATED RINGERS IV SOLN: 500.0000 mL | INTRAVENOUS | Status: DC | PRN

## 2020-10-08 MED ORDER — COCONUT OIL OIL: 1.0000 "application " | TOPICAL_OIL | Status: DC | PRN

## 2020-10-08 MED ORDER — WITCH HAZEL-GLYCERIN EX PADS: 1.0000 "application " | MEDICATED_PAD | CUTANEOUS | Status: DC | PRN

## 2020-10-08 MED ORDER — SENNOSIDES-DOCUSATE SODIUM 8.6-50 MG PO TABS
2.0000 | ORAL_TABLET | Freq: Every day | ORAL | Status: DC
  Administered 2020-10-09 – 2020-10-11 (×3): 2 via ORAL
  Filled 2020-10-08 (×3): qty 2

## 2020-10-08 MED ORDER — OXYCODONE-ACETAMINOPHEN 5-325 MG PO TABS
1.0000 | ORAL_TABLET | ORAL | Status: DC | PRN
  Administered 2020-10-08 (×2): 1 via ORAL
  Filled 2020-10-08 (×2): qty 1

## 2020-10-08 MED ORDER — DIPHENHYDRAMINE HCL 25 MG PO CAPS
25.0000 mg | ORAL_CAPSULE | Freq: Four times a day (QID) | ORAL | Status: DC | PRN
Start: 2020-10-08 — End: 2020-10-11

## 2020-10-08 MED ORDER — DIPHENHYDRAMINE HCL 50 MG/ML IJ SOLN: 12.5000 mg | INTRAMUSCULAR | Status: DC | PRN

## 2020-10-08 MED ORDER — ONDANSETRON HCL 4 MG PO TABS: 4.0000 mg | ORAL_TABLET | ORAL | Status: DC | PRN

## 2020-10-08 MED ORDER — SOD CITRATE-CITRIC ACID 500-334 MG/5ML PO SOLN: 30.0000 mL | ORAL | Status: DC | PRN

## 2020-10-08 MED ORDER — OXYCODONE-ACETAMINOPHEN 5-325 MG PO TABS: 2.0000 | ORAL_TABLET | ORAL | Status: DC | PRN

## 2020-10-08 MED ORDER — LACTATED RINGERS IV SOLN: INTRAVENOUS | Status: DC

## 2020-10-08 MED ORDER — ACETAMINOPHEN 325 MG PO TABS: 650.0000 mg | ORAL_TABLET | ORAL | Status: DC | PRN

## 2020-10-08 MED ORDER — OXYTOCIN-SODIUM CHLORIDE 30-0.9 UT/500ML-% IV SOLN
2.5000 [IU]/h | INTRAVENOUS | Status: DC
  Administered 2020-10-08: 2.5 [IU]/h via INTRAVENOUS
  Filled 2020-10-08: qty 500

## 2020-10-08 MED ORDER — LABETALOL HCL 5 MG/ML IV SOLN: 20.0000 mg | INTRAVENOUS | Status: DC | PRN

## 2020-10-08 MED ORDER — FENTANYL-BUPIVACAINE-NACL 0.5-0.125-0.9 MG/250ML-% EP SOLN
12.0000 mL/h | EPIDURAL | Status: DC | PRN
  Filled 2020-10-08: qty 250

## 2020-10-08 MED ORDER — ZOLPIDEM TARTRATE 5 MG PO TABS: 5.0000 mg | ORAL_TABLET | Freq: Every evening | ORAL | Status: DC | PRN

## 2020-10-08 MED ORDER — HYDRALAZINE HCL 20 MG/ML IJ SOLN
10.0000 mg | INTRAMUSCULAR | Status: DC | PRN
  Administered 2020-10-08: 10 mg via INTRAVENOUS
  Filled 2020-10-08: qty 1

## 2020-10-08 MED ORDER — TETANUS-DIPHTH-ACELL PERTUSSIS 5-2.5-18.5 LF-MCG/0.5 IM SUSY
0.5000 mL | PREFILLED_SYRINGE | Freq: Once | INTRAMUSCULAR | Status: DC
  Filled 2020-10-08: qty 0.5

## 2020-10-08 NOTE — Progress Notes (Addendum)
Page to cardiology around 3 pm, 4 pm. Cardiology consult requested d/t patient's h/o ventricular bigeminy ('cardiology master' number) No f/u prior to pregnancy and pt did not have any follow up during pregnancy - pt had not admitted to cardiac history prior today Discussed pt s/p uncomplicated svd, covid test positive on admisson  Spoke with Dr Herbie [place]:  Pt reviewed, formal consult pending for am Reviewed bps and will begin hydralazine 50mg  po bid, begin now

## 2020-10-08 NOTE — Progress Notes (Signed)
Late Entry Note  After patient delivered, her vital signs were monitored and persistent bradycardia was noted. A stat EKG was obtained which revealed abnormal findings including ventricular bigeminy, left ventricular hypertrophy, and prolonged QT interval. Given patient's cardiac history and failure to follow up, decision was made to obtain Cardiology consultation for further recommendations. The AMION paging service was used to request consultation to the '[facility]' was placed initially at 11AM. After 1.5 hours of no response a second page was made at 1300. At this point, patient's labor and delivery recovery was complete. The decision was made to transfer her to [facility] to allow for Telemetry monitoring while awaiting Cardiology consultation for next steps. Patient remained asymptomatic throughout this time  Toy Baker 10/08/20 7:24 PM

## 2020-10-08 NOTE — MAU Note (Signed)
After call to pharmacist , Dr Conni Elliot notified that pharmacy states to begin with the Apresoline due to the fact that labetalol with slow heart rate

## 2020-10-08 NOTE — MAU Note (Signed)
Presents with ctxs every 5 minutes apart.  Denies LOF, reports has bloody mucous discharge.  Endorses +FM.

## 2020-10-08 NOTE — MAU Note (Signed)
Call to pharmacy to release meds

## 2020-10-08 NOTE — H&P (Signed)
Sonora Catlin is a 30 y.o. female G2P1 [redacted]w[redacted]d presenting for labor. Patient reports contractions started around 3AM, strong and regular in timing. No LOF or VB. Good FM. No other complaints, denies HA, vision changes, CP, SOB.  Patient was a late transfer of care to [facility] at 34 weeks. Had previously been receiving routine [facility] with [facility] clinic. Records received from their office showed:  Patient established [facility] at 17.6 planned desired pregnancy- dated by 2nd trimester [place]. Routine PNC since then. Normal anatomy US expecting a girl. Normal OB1 labs from 05/08/20: Blood type A POS, HepBsAg NEG, HIV NR, RPR NR, NG/CT NEG, Varicella Immune, Rubella Immune, normal hemoglobin electrophoresis, and low risk Quad screen. Normal 3rd tri labs 07/16/20: 1hr GTT 121 and H/H 12.1/36.9 platelets 152 and RPR NR. Received Tdap on 08/01/20. First pregnancy was a SVD at 40w with delivery of "almost 8 pound" baby boy uncomplicated per patient report. She had a follow up growth Korea in our office at 36 weeks, revealing Vertex presentation EFW 6#1 (33%) HC 30% AC 55% AFI 11.4cm ant plac, +FM and breathing. Has had a NEG GBS screen as well.  Patient reported no significant PMH/PSH at time of establishing [facility]. She has also remained normotensive with no prior history of elevated BP's. However upon presentation today patient is found to have elevated BP and bradycardia with pulse in the 40's. She is asymptomatic, denies any feelings of palpitations, CP, pressure, or SOB. When asked about any cardiac history she then reveals an episode in 2018 where she was hospitalized for 2 days, had extensive work up, and was told she had an arrhythmia. Patient is poor historian and unable to recall details. On chart review, there is an ER encounter from [place], [place] in 2017 where patient presented with CP/SOB and found to have ventricular bigeminy. She had an echocardiogram that showed normal LVEF of 59%, Per the outpatient  Cardiology note with [facility] 11/2015 no additional treatment required but recommended follow up in 1 year. Patient denies any additional follow up since that time.    OB History     Gravida  2   Para  1   Term      Preterm      AB      Living  1      SAB      IAB      Ectopic      Multiple      Live Births             Past Medical History:  Diagnosis Date   Medical history non-contributory    Pes planus of both feet    Past Surgical History:  Procedure Laterality Date   NO PAST SURGERIES     Family History: family history is not on file. Social History:  reports that she does not drink alcohol and does not use drugs. No history on file for tobacco use.     Maternal Diabetes: No Genetic Screening: Normal Maternal Ultrasounds/Referrals: Normal Fetal Ultrasounds or other Referrals:  None Maternal Substance Abuse:  No Significant Maternal Medications:  None Significant Maternal Lab Results:  Group B Strep negative Other Comments:  None  Review of Systems  All other systems reviewed and are negative. Per HPI Maternal Exam:  Uterine Assessment: Contraction strength is firm.  Contraction frequency is regular.  Abdomen: Patient reports no abdominal tenderness. Estimated fetal weight is 7.5#.   Fetal presentation: vertex Introitus: Normal vulva. Pelvis:  adequate for delivery.   Cervix: Cervix evaluated by digital exam.     Fetal Exam Fetal Monitor Review: Baseline rate: 115.  Variability: moderate (6-25 bpm).   Pattern: accelerations present and no decelerations.   Fetal State Assessment: Category I - tracings are normal.  Physical Exam Vitals reviewed.  Constitutional:      Appearance: Normal appearance.  HENT:     Head: Normocephalic.  Cardiovascular:     Rate and Rhythm: Bradycardia present.  Pulmonary:     Effort: Pulmonary effort is normal.  Genitourinary:    General: Normal vulva.  Skin:    General: Skin is warm and dry.   Neurological:     General: No focal deficit present.     Mental Status: She is alert and oriented to person, place, and time.  Psychiatric:        Mood and Affect: Mood normal.        Behavior: Behavior normal.    Dilation: 5 Effacement (%): 90 Station: -1 Exam by:: Senai Ramnath MD Blood pressure (!) 163/83, pulse (!) 46, temperature 98.1 F (36.7 C), temperature source Oral, resp. rate 18, height 5\' 3"  (1.6 m), weight 79.9 kg, SpO2 100 %.  Prenatal labs: ABO, Rh:  --/--/A POS (07/13 0815) Antibody: NEG (07/13 0815) Rubella:  IMMUNE RPR:   NR HBsAg:   NEG HIV:   NR GBS: Negative/-- (06/29 0000)    Assessment/Plan: Brie Eppard is a 30 y.o. female G2P1 [redacted]w[redacted]d admitted with active labor.  -Admission to LD for labor mgmt -Routine admission labs -Elevated BP, persistent severe range responded to 10mg  IV Hydralazine, no other symptoms of PEC, PIH labs added to admission labs. Likely pain contributor, will cont to monitor -Bradycardia with hx of ventricular bigeminy, will obtain EKG and consider telemetry if indicated -Pt requesting epidural, waiting on labs -GBS NEG -RH POS, Rubella Imm -Routine intrapartum care -Anticipate SVD  Leathie Weich A Trinnity Breunig 10/08/2020, 9:50 AM

## 2020-10-09 ENCOUNTER — Encounter (HOSPITAL_COMMUNITY): Payer: Self-pay | Admitting: Obstetrics and Gynecology

## 2020-10-09 DIAGNOSIS — I493 Ventricular premature depolarization: Secondary | ICD-10-CM

## 2020-10-09 LAB — CBC
HCT: 26.7 % — ABNORMAL LOW (ref 36.0–46.0)
Hemoglobin: 9 g/dL — ABNORMAL LOW (ref 12.0–15.0)
MCH: 31.8 pg (ref 26.0–34.0)
MCHC: 33.7 g/dL (ref 30.0–36.0)
MCV: 94.3 fL (ref 80.0–100.0)
Platelets: 124 10*3/uL — ABNORMAL LOW (ref 150–400)
RBC: 2.83 MIL/uL — ABNORMAL LOW (ref 3.87–5.11)
RDW: 13.5 % (ref 11.5–15.5)
WBC: 14.9 10*3/uL — ABNORMAL HIGH (ref 4.0–10.5)
nRBC: 0 % (ref 0.0–0.2)

## 2020-10-09 MED ORDER — OXYCODONE-ACETAMINOPHEN 5-325 MG PO TABS
1.0000 | ORAL_TABLET | Freq: Four times a day (QID) | ORAL | Status: DC | PRN
  Administered 2020-10-09 (×2): 1 via ORAL
  Filled 2020-10-09 (×2): qty 1

## 2020-10-09 NOTE — Consult Note (Addendum)
Cardiology Consultation:   Patient ID: Anna Grant MRN: [id number]; DOB: Jul 11, 1990  Admit date: 10/08/2020 Date of Consult: 10/09/2020  PCP:  Patient, No Pcp Per (Inactive)   [facility] Providers Cardiologist:  None       Patient Profile:   Anna Grant is a 30 y.o. female with a hx of PVCs and normal echo, stress test with no ischemia who is being seen 10/09/2020 for the evaluation of Bradycardia and PVCs at the request of Dr. Lanny Cramp.  History of Present Illness:   Anna Grant with prior hx of univocal ventricular ectopy in 2017 of 15-20% on hospital tele at that time and had normal ECHO and on nuc study exercised through stage III of Bruce protocol with HR of 160 and ventricular exercise suppressed with exercise.  In recovery PVCs noted including a couplet.  Some non specific ST -T wave changes noted in recovery.  No perfusion defects and EF 59%.  Seen with [place] cardiology   Pt now admitted 7/13/22to [facility] with labor.  G2P1, [id number].  On presentation her BP was elevated  167/91 and pulse was in the 40s, though asymptomatic.     She delivered 10/08/20 a female.  She did test + for COVID on admit.   EKG:  The EKG was personally reviewed and demonstrates:  SR at 34 with Bigemny PVCs.   Telemetry:  Telemetry was personally reviewed and demonstrates:  SR with big. PVCs and SR  Labs Na 134, k+ 4.2 alk phos 167 Hgb on admit 12.1 today 9.0 WBC 14.9 plts 124 down from 138.    BP 142/79 P 85 afebrile to 132/90 pt not aware of PVCs and was not aware in past.     Past Medical History:  Diagnosis Date   Medical history non-contributory    Pes planus of both feet    PVC (premature ventricular contraction)     Past Surgical History:  Procedure Laterality Date   NO PAST SURGERIES       Home Medications:  Prior to Admission medications   Medication Sig Start Date End Date Taking? Authorizing Provider  Prenatal Vit-Fe Fumarate-FA (PRENATAL MULTIVITAMIN) TABS tablet Take 1 tablet  by mouth daily at 12 noon.   Yes [provider]    Inpatient Medications: Scheduled Meds:  hydrALAZINE  50 mg Oral BID   ibuprofen  600 mg Oral Q6H   prenatal multivitamin  1 tablet Oral Q1200   senna-docusate  2 tablet Oral Daily   Tdap  0.5 mL Intramuscular Once   Continuous Infusions:  PRN Meds: acetaminophen, benzocaine-Menthol, coconut oil, witch hazel-glycerin **AND** dibucaine, diphenhydrAMINE, lidocaine (PF), ondansetron **OR** ondansetron (ZOFRAN) IV, oxyCODONE-acetaminophen, simethicone, zolpidem  Allergies:   No Known Allergies  Social History:   Social History   Socioeconomic History   Marital status: Single    Spouse name: Not on file   Number of children: Not on file   Years of education: Not on file   Highest education level: Not on file  Occupational History   Not on file  Tobacco Use   Smoking status: Never   Smokeless tobacco: Not on file  Substance and Sexual Activity   Alcohol use: No    Alcohol/week: 0.0 standard drinks   Drug use: No   Sexual activity: Never  Other Topics Concern   Not on file  Social History Narrative   Not on file   Social Determinants of Health   Financial Resource Strain: Not on file  Food Insecurity: Not  on file  Transportation Needs: Not on file  Physical Activity: Not on file  Stress: Not on file  Social Connections: Not on file  Intimate Partner Violence: Not on file    Family History:   History reviewed. No pertinent family history. No cardiac hx pertinent to this admit  ROS:  Please see the history of present illness.  General:no colds or fevers, + weight changes with preg. Skin:no rashes or ulcers HEENT:no blurred vision, no congestion CV:see HPI PUL:see HPI GI:no diarrhea constipation or melena, no indigestion GU:no hematuria, no dysuria MS:no joint pain, no claudication Neuro:no syncope, no lightheadedness Endo:no diabetes, no thyroid disease  All other ROS reviewed and negative.      Physical Exam/Data:   Vitals:   10/09/20 0840 10/09/20 1211 10/09/20 1517 10/09/20 1520  BP: (!) 142/79 132/90 (!) 154/87 (!) 145/83  Pulse: (!) 42 80 (!) 40 (!) 41  Resp: _0 Temp: 97.8 F (36.6 C) 98.2 F (36.8 C) 98.2 F (36.8 C)   TempSrc: Oral Oral Oral   SpO2: 100% 100% 100%   Weight:      Height:       No intake or output data in the 24 hours ending 10/09/20 1530 Last 3 Weights 10/08/2020 02/12/2016 02/05/2014  Weight (lbs) 176 lb 1.6 oz 109 lb 138 lb 9.6 oz  Weight (kg) 79.878 kg 49.442 kg 62.869 kg     Body mass index is 31.19 kg/m.   Exam: GEN: No acute distress.   Neck: No JVD Cardiac: Regularly irregular rate and pulse, no murmurs, rubs, or gallops.  Respiratory: Clear to auscultation bilaterally. GI: Soft, nontender, slight tenderness; examined with Daughter Anna Grant on the chest MS: No pitting edema; No deformity. Neuro:  Nonfocal  Psych: Normal affect   Relevant CV Studies: Echo 2017  Ref Range & Units 4 yr ago  IVS (2D) 0.6 - 0.9 cm 0.8   LVIDD (2D) 3.9 - 5.3 cm 4.4   LVIDS (2D) 2.2 - 3.5 cm 3.1   LVOT diameter cm 1.9   LVOT Mean Gradient mmHg 2.0   LVOT Vmax cm/s 85.0   LVOT peak gradient mmHg 3.0   LVPWd (2D) 0.6 - 0.9 cm 0.8   MV E/e' Lateral Ratio  4.7   MV E/e' Septal Ratio  7.1   LVOT area cm2 2.8   LA volume cm3 37.8   LA dimension (2D) cm 2.4   LA Volume Index 16 - 34 ml/m2 25.9   AV mean gradient mmHg 3.0   Ao Vmax cm/s 115.0   AV peak gradient mmHg 5.0   AVA (continuity VTI) cm2 2.1   AVA (continuity Vmax) cm2 2.1   Ao root diameter cm 2.3   Ascending aorta cm 2.6   MV deceleration time 160 - 200 ms 261.0 Abnormal    MV Peak A Vel cm/s 47.1   MV Peak E Vel cm/s 82.0   MV mean gradient mmHg 2.0   MV peak gradient mmHg 3.0   E/A ratio 0.5 - 1.5 1.7 Abnormal    Pulmonary artery acceleration time ms 148.0   PV Peak Velocity cm/s 91.9   PV peak gradient mmHg 3.0   RA R/L Diam cm 3.4   BP mmHg 92/56   BSA m2 1.45    Weight oz 1,696   Height (In) in 62   LA R/L Diam cm 3.7   RV Free Wall Peak S' cm/s 11.9   Pulmonary Artery Mean Presure mmHg  13.5   BP EF % 56   Weight lb 106   Resulting Agency  [facility]   Narrative Performed by [facility] This result has an attachment that is not available.   The left ventricular systolic function is normal (55-65%).   Normal left ventricular diastolic function.   Normal chamber sizes.   No significant valvular abnormalities.   Left Ventricle  Normal left ventricle cavity size. Normal left ventricle wall thickness. The systolic function is normal (55-65%). Normal left ventricular diastolic function.   Right Ventricle  Normal right ventricular cavity size. Normal right ventricular systolic function. RV tissue doppler velocity is 11.9 cm/s.   Left Atrium  The left atrium is normal in size.   Right Atrium  The right atrium is normal in size.   IVC/SVC  The Inferior Vena Cava is normal in size. The IVC shows >50% inspiratory collapse.   Mitral Valve  The mitral valve is normal. No mitral valve stenosis. No mitral valve regurgitation.   Tricuspid Valve  The tricuspid valve is normal. No tricuspid valve regurgitation. unable to assess RVSP.   Aortic Valve  The aortic valve is normal. No aortic valve stenosis. No aortic valve regurgitation.   Pulmonic Valve  The pulmonic valve was not well visualized.   Pericardium  There is no pericardial effusion present.   TTE Procedure Information  Images were obtained from the parasternal, apical and subcostal view(s).   Aorta  The aortic root is normal in size. Specimen Collected: -- Last Resulted: 12/11/15 13:45  Received From: [facility]  Result Received: 10/08/20 07:31   View Encounter    Stress test  9/132/17  This result has an attachment that is not available.   Protocol: Bruce   Resting ECG: NSR, NSST changes, PVCs   Exercise Capacity: excellent.   Hemodynamic Response: submaximal .    Symptoms: shortness of breath.   Stress ECG: non diagnostic.   LV Function: EF is 59%. LV function is normal. The wall motion is  normal.   Left ventricle cavity size is normal.   Left ventricular perfusion: normal.   Stress Findings  A stress test was performed following the Bruce protocol  The patient reached the end of the protocol.  The patient reported shortness of breath during the stress test. Onset of symptoms occurred at stage 4 of the protocol. The patient experienced no angina during the stress test.  Blood pressure demonstrated a normal response to stress. Hemodynamic response demonstrated submaximal response to stress. Overall, the patient's exercise capacity was excellent.  The Duke Treadmill Risk Score is 12. Low risk treadmill score (>5).    Laboratory Data:  High Sensitivity Troponin:  No results for input(s): TROPONINIHS in the last 720 hours.   Chemistry Recent Labs  Lab 10/08/20 0815  NA 134*  K 4.2  CL 105  CO2 20*  GLUCOSE 78  BUN 11  CREATININE 0.74  CALCIUM 8.4*  GFRNONAA >60  ANIONGAP 9    Recent Labs  Lab 10/08/20 0815  PROT 5.6*  ALBUMIN 2.8*  AST 41  ALT 30  ALKPHOS 167*  BILITOT 0.5   Hematology Recent Labs  Lab 10/08/20 0815 10/09/20 0506  WBC 10.0 14.9*  RBC 3.95 2.83*  HGB 12.1 9.0*  HCT 36.9 26.7*  MCV 93.4 94.3  MCH 30.6 31.8  MCHC 32.8 33.7  RDW 13.1 13.5  PLT 138* 124*   BNPNo results for input(s): BNP, PROBNP in the last 168 hours.  DDimer No results for input(s):  DDIMER in the last 168 hours.   Radiology/Studies:  No results found.   Assessment and Plan:   Reported sinus brady, unsure if PVCs not palpated that seemed to be slow pulse.  Continues with PVCs big. At times.  Did well with delivery.  Prior Bigeminy PVCs  with normal echo and normal stress myoview.  Labs stable with slightly low Hgb 9.0 post delivery.  May consider Echo to see if any changes. Will defer to MD.  S/p delivery of baby girl COVID 19  virus   Risk Assessment/Risk Scores:       - CARPREG II Score of 4 for arrhythmias (though none serious)    For questions or updates, please contact [place] Please consult [email or web address] for contact info under    Signed, Cecilie Kicks, NP  10/09/2020 3:30 PM   Personally seen and examined. Agree with APP above with the following comments: Briefly 30 yo G2 F with previous history of frequent PVCs seen by [facility] in 2017; had negative CTPE, Normal Echo, normal stress test.  Peri-delivery had notable PVC (bigeminy).  Incidentally found to have COVID-19. Patient notes that since she has been told she has these PVCs, she has been feeling short of breath and anxious, but cannot discern this from before she was told she was having these rhythms.  Had weight gain, SOB, and leg swelling in last trimester.  No PVCs with first pregnancy.  Exam in the body of the note is mine; irregular heart beats noted. Labs notable for stable K and creatinine Personally reviewed relevant tests; ECG shows SR with frequent PVCs in the pattern of bigeminy.  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 - outpatient, non live heart monitor could be considered. - if echo is normal, this should not preclude her anticipated discharge of 10/10/20   Rudean Haskell, MD [address] [place], [place] [address] [phone]  4:45 PM

## 2020-10-09 NOTE — Progress Notes (Signed)
C/o mild nasal congestion; no sob/cp, nml lochia, voiding w/o difficulty No f/c/s   Patient Vitals for the past 24 hrs:  BP Temp Temp src Pulse Resp SpO2  10/09/20 0840 (!) 142/79 97.8 F (36.6 C) Oral (!) 42 17 100 %  10/08/20 2307 (!) 148/58 98 F (36.7 C) Oral (!) 45 16 --  10/08/20 2043 (!) 151/77 98 F (36.7 C) Oral (!) 41 18 --  10/08/20 1613 (!) 156/85 98.4 F (36.9 C) Oral (!) 41 16 --  10/08/20 1353 116/81 -- -- (!) 43 16 --  10/08/20 1300 -- -- -- -- 16 --  10/08/20 1247 (!) 146/75 -- -- (!) 43 16 --  10/08/20 1232 118/81 -- -- (!) 52 17 --  10/08/20 1217 137/72 -- -- (!) 48 17 --  10/08/20 1202 (!) 146/82 -- -- -- 17 --  10/08/20 1147 (!) 148/77 -- -- (!) 40 17 --  10/08/20 1132 132/82 -- -- (!) 48 17 --  10/08/20 1126 -- -- -- -- -- 100 %  10/08/20 1121 -- -- -- -- -- 100 %  10/08/20 1117 (!) 148/82 -- -- -- 18 --  10/08/20 1102 (!) 131/112 -- -- (!) 44 16 --  10/08/20 1056 -- -- -- -- -- 100 %   A&ox3 Heart: irreg rhythm Ctab Abd: soft, nt, fundus firm and below umb LE: no edema, nt bilat  CBC Latest Ref Rng & Units 10/09/2020 10/08/2020 02/05/2014  WBC 4.0 - 10.5 K/uL 14.9(H) 10.0 6.3  Hemoglobin 12.0 - 15.0 g/dL 9.0(L) 12.1 13.2  Hematocrit 36.0 - 46.0 % 26.7(L) 36.9 39.3  Platelets 150 - 400 K/uL 124(L) 138(L) 221    A/P: ppd1 s/p svd Doing well pp 2.  H/o ventricular bigeminy-ekg yesterday: ventricular bigeminy, left ventricular hypertrophy, and prolonged QT interval - cardiology consulted, pending though aware and following pt; asymptomatic 3. New onset elevated bp with admission - labs wnl, pc ratio c/w pre-eclampsia; pt had severe range bp on admit however mild range only since given hydralazine on admission; mild pre-eclampsia, and will follow closely; hydralazine started last night to manage mild range bps, 50mg  bid per cards, 4. Mild acute anemia - asymptomatic, plan iron q day 5. Covid positive - mild sx

## 2020-10-10 ENCOUNTER — Inpatient Hospital Stay (HOSPITAL_COMMUNITY): Payer: 59

## 2020-10-10 DIAGNOSIS — I498 Other specified cardiac arrhythmias: Secondary | ICD-10-CM

## 2020-10-10 DIAGNOSIS — I428 Other cardiomyopathies: Secondary | ICD-10-CM

## 2020-10-10 LAB — ECHOCARDIOGRAM LIMITED
Height: 63 in
Weight: 2817.6 oz

## 2020-10-10 MED ORDER — LABETALOL HCL 100 MG PO TABS
100.0000 mg | ORAL_TABLET | Freq: Two times a day (BID) | ORAL | Status: DC
  Administered 2020-10-10: 100 mg via ORAL
  Filled 2020-10-10: qty 1

## 2020-10-10 MED ORDER — FUROSEMIDE 40 MG PO TABS
20.0000 mg | ORAL_TABLET | Freq: Once | ORAL | Status: AC
  Administered 2020-10-10: 20 mg via ORAL
  Filled 2020-10-10: qty 1

## 2020-10-10 MED ORDER — FUROSEMIDE 40 MG PO TABS: 20.0000 mg | ORAL_TABLET | Freq: Once | ORAL | Status: DC

## 2020-10-10 MED ORDER — METOPROLOL TARTRATE 25 MG PO TABS
25.0000 mg | ORAL_TABLET | Freq: Two times a day (BID) | ORAL | Status: DC
  Administered 2020-10-10 – 2020-10-11 (×2): 25 mg via ORAL
  Filled 2020-10-10 (×4): qty 1

## 2020-10-10 NOTE — Progress Notes (Signed)
Echocardiogram 2D Echocardiogram has been performed.  Warren Lacy Ghada Abbett RDCS 10/10/2020, 8:01 AM

## 2020-10-10 NOTE — Progress Notes (Signed)
Called Dr Amado Nash with the patients BP and MD prefers for patient to stay overnight to evaluate the effectiveness of the new BP medication. RN called to patient room and told her the POC was to keep her overnight to monitor BPs and possibly send home in the morning. Patient agreeable to plan.

## 2020-10-10 NOTE — Progress Notes (Signed)
PPD #2, SVD. Girl  Covid19 + with mild nasal congestion s/s, no fever Bigemini, Cardiology following, appreciated  S: Has mild nasal congestion; no SOB/ CP or palpitations.No HA/ RUQ pain.  Minimal lochia, voiding w/o difficulty, no perineal pain.  Bottle feeding but plans to initiate breast feeding after going home. Breast fed 1st kid for 1 mo, low supply and had to return to [facility]  Patient Vitals for the past 24 hrs:  BP Temp Temp src Pulse Resp SpO2  10/10/20 0825 (!) 141/79 98.1 F (36.7 C) Oral (!) 41 18 99 %  10/10/20 0325 (!) 154/81 98.2 F (36.8 C) Oral (!) 45 18 98 %  10/09/20 2342 -- 98 F (36.7 C) Oral (!) 44 18 97 %  10/09/20 2214 (!) 146/68 -- -- (!) 45 -- --  10/09/20 1939 -- 98.2 F (36.8 C) Oral -- 17 97 %  10/09/20 1520 (!) 145/83 -- -- (!) 41 -- --  10/09/20 1517 (!) 154/87 98.2 F (36.8 C) Oral (!) 40 17 100 %  10/09/20 1211 132/90 98.2 F (36.8 C) Oral 80 18 100 %    A&ox3. Unlabored speech EKG pulse 80s  Heart: irreg rhythm Ctab Abd: soft, nt, fundus firm and below umb LE: no edema, nt bilat  CBC Latest Ref Rng & Units 10/09/2020 10/08/2020 02/05/2014  WBC 4.0 - 10.5 K/uL 14.9(H) 10.0 6.3  Hemoglobin 12.0 - 15.0 g/dL 9.0(L) 12.1 13.2  Hematocrit 36.0 - 46.0 % 26.7(L) 36.9 39.3  Platelets 150 - 400 K/uL 124(L) 138(L) 221    A/P: PPD #2, SVD. Doing well pp 1) Ventricular bigeminy-ekg done. Echo this morning, result /Cariology recc pending. 2) Pre-eclampsia, mild range BPs after initial severe at admission. Hydralazine 50mg  q12hr and add Labetalol per Cardiology recommendation, will start at 100mg  po bid since EKG rate is 80.  3) Mild acute anemia - asymptomatic, plan iron q day 4) Covid positive - mild sx Discharge planning pending Echo and Cardio input if Echo abn and titrate BPs   ,MD

## 2020-10-10 NOTE — Progress Notes (Signed)
Cardiology Progress Note  Patient ID: Anna Grant MRN: [id number] DOB: Aug 14, 1990 Date of Encounter: 10/10/2020  Primary Cardiologist: None  Subjective   Chief Complaint: Shortness of breath  HPI: Still having a lot of PVCs.  1+ pitting edema.  Needs a little better diuresis.  ROS:  All other ROS reviewed and negative. Pertinent positives noted in the HPI.     Inpatient Medications  Scheduled Meds:  furosemide  20 mg Oral Once   hydrALAZINE  50 mg Oral BID   ibuprofen  600 mg Oral Q6H   labetalol  100 mg Oral BID   prenatal multivitamin  1 tablet Oral Q1200   senna-docusate  2 tablet Oral Daily   Tdap  0.5 mL Intramuscular Once   Continuous Infusions:  PRN Meds: acetaminophen, benzocaine-Menthol, coconut oil, witch hazel-glycerin **AND** dibucaine, diphenhydrAMINE, ondansetron **OR** ondansetron (ZOFRAN) IV, oxyCODONE-acetaminophen, simethicone, zolpidem   Vital Signs   Vitals:   10/09/20 2342 10/10/20 0325 10/10/20 0825 10/10/20 1056  BP:  (!) 154/81 (!) 141/79 121/81  Pulse: (!) 44 (!) 45 (!) 41 (!) 42  Resp: 18 18 18 17   Temp: 98 F (36.7 C) 98.2 F (36.8 C) 98.1 F (36.7 C)   TempSrc: Oral Oral Oral   SpO2: 97% 98% 99%   Weight:      Height:        Intake/Output Summary (Last 24 hours) at 10/10/2020 1137 Last data filed at 10/10/2020 0300 Gross per 24 hour  Intake 1220 ml  Output --  Net 1220 ml   Last 3 Weights 10/08/2020 02/12/2016 02/05/2014  Weight (lbs) 176 lb 1.6 oz 109 lb 138 lb 9.6 oz  Weight (kg) 79.878 kg 49.442 kg 62.869 kg      Telemetry  Overnight telemetry shows ventricular bigeminy, which I personally reviewed.   ECG  The most recent ECG shows ventricular bigeminy, which I personally reviewed.   Physical Exam   Vitals:   10/09/20 2342 10/10/20 0325 10/10/20 0825 10/10/20 1056  BP:  (!) 154/81 (!) 141/79 121/81  Pulse: (!) 44 (!) 45 (!) 41 (!) 42  Resp: 18 18 18 17   Temp: 98 F (36.7 C) 98.2 F (36.8 C) 98.1 F (36.7 C)    TempSrc: Oral Oral Oral   SpO2: 97% 98% 99%   Weight:      Height:        Intake/Output Summary (Last 24 hours) at 10/10/2020 1137 Last data filed at 10/10/2020 0300 Gross per 24 hour  Intake 1220 ml  Output --  Net 1220 ml    Last 3 Weights 10/08/2020 02/12/2016 02/05/2014  Weight (lbs) 176 lb 1.6 oz 109 lb 138 lb 9.6 oz  Weight (kg) 79.878 kg 49.442 kg 62.869 kg    Body mass index is 31.19 kg/m.  General: Well nourished, well developed, in no acute distress Head: Atraumatic, normal size  Eyes: PEERLA, EOMI  Neck: Supple, JVD 7 8 cm water Endocrine: No thryomegaly Cardiac: Normal S1, S2; RRR; no murmurs, rubs, or gallops Lungs: Clear to auscultation bilaterally, no wheezing, rhonchi or rales  Abd: Soft, nontender, no hepatomegaly  Ext: 1+ pitting edema  Musculoskeletal: No deformities, BUE and BLE strength normal and equal Skin: Warm and dry, no rashes   Neuro: Alert and oriented to person, place, time, and situation, CNII-XII grossly intact, no focal deficits  Psych: Normal mood and affect   Labs  High Sensitivity Troponin:  No results for input(s): TROPONINIHS in the last 720 hours.   Cardiac EnzymesNo  results for input(s): TROPONINI in the last 168 hours. No results for input(s): TROPIPOC in the last 168 hours.  Chemistry Recent Labs  Lab 10/08/20 0815  NA 134*  K 4.2  CL 105  CO2 20*  GLUCOSE 78  BUN 11  CREATININE 0.74  CALCIUM 8.4*  PROT 5.6*  ALBUMIN 2.8*  AST 41  ALT 30  ALKPHOS 167*  BILITOT 0.5  GFRNONAA >60  ANIONGAP 9    Hematology Recent Labs  Lab 10/08/20 0815 10/09/20 0506  WBC 10.0 14.9*  RBC 3.95 2.83*  HGB 12.1 9.0*  HCT 36.9 26.7*  MCV 93.4 94.3  MCH 30.6 31.8  MCHC 32.8 33.7  RDW 13.1 13.5  PLT 138* 124*   BNPNo results for input(s): BNP, PROBNP in the last 168 hours.  DDimer No results for input(s): DDIMER in the last 168 hours.   Radiology  No results found.  Cardiac Studies  TTE 10/10/2020  1. Left ventricular  ejection fraction, by estimation, is 50%. The left  ventricle has mildly decreased function. The left ventricle demonstrates  global hypokinesis. Difficult to quantify LV systolic function given  bigeminal PVCs. Left ventricular  diastolic parameters were normal.   2. Right ventricular systolic function is normal. The right ventricular  size is normal. Tricuspid regurgitation signal is inadequate for assessing  PA pressure.   3. The mitral valve is normal in structure. Mild mitral valve  regurgitation. No evidence of mitral stenosis.   4. The aortic valve is tricuspid.   5. The inferior vena cava is dilated in size with >50% respiratory  variability, suggesting right atrial pressure of 8 mmHg.   6. The patient showed persistent bigeminal PVCs. The   Patient Profile  Anna Grant is a 30 y.o. female with history of PVCs who was admitted on 10/08/2020 for labor and subsequently underwent delivery with a baby girl.  Cardiology was consulted for frequent PVCs.  Assessment & Plan   Ventricular bigeminy -Extensive work-up in 2017 showed normal LV function.  She had an echocardiogram at that time that was normal.  She had a stress test that was normal. -Admitted for labor and delivery.  Had a successful and uneventful delivery. -Remains with ventricular bigeminy. -She is on labetalol and hydralazine for hypertension. -Ejection fraction is 50%. -She seems a bit volume up.  We will give her 20 mg of p.o. Lasix as one-time dose if okay per her obstetrician. -Would also prefer her to be switched to metoprolol to tartrate or succinate if able.  If not would recommend to continue labetalol.  There is no urgency to put her on this.  Her EF is normal and she just needs to be followed in several months for possible PVC ablation.  If this will be an issue with breast-feeding would discharge her with her labetalol and hydralazine. -We will arrange hospital follow-up with Dr. Lenor Derrick for further  management.  2.  Bradycardia -She is not actually bradycardic.  Her actual rate is in the 80s.  Due to PVCs her pulse by palpation will be in the 40s.  3.  Hypertension -Labetalol and hydralazine.  Well-controlled.  Consider transition to metoprolol if able.  However if this will be present in breastmilk would recommend to avoid this until she has completed breast-feeding.  [facility] will sign off.   Medication Recommendations: As above Other recommendations (labs, testing, etc): None Follow up as an outpatient: We will arrange hospital follow-up in 6 to 8 weeks with Dr. Lenor Derrick  For questions or updates, please contact [facility] Please consult [email or web address] for contact info under   Time Spent with Patient: I have spent a total of 25 minutes with patient reviewing hospital notes, telemetry, EKGs, labs and examining the patient as well as establishing an assessment and plan that was discussed with the patient.  > 50% of time was spent in direct patient care.    Signed, Lenna Gilford. Flora Lipps, MD, [facility]  [facility]  10/10/2020 11:37 AM

## 2020-10-10 NOTE — Progress Notes (Signed)
Called Dr. Juliene Pina to update on holding labetalol due to HR being 42, although the EKG is showing 80s. Per Dr. Juliene Pina it is fine to administer Labetalol due to the cardiologist recommendation.  Dr. Juliene Pina will review the ECHO results.

## 2020-10-10 NOTE — Progress Notes (Addendum)
Appreciate Cardio/ Dr Marylene Buerger input.  Echo report noted.  Pt has not started breast feeding and doesn't intend to do it more than 1 mo. Will stop Labetalol and add Metoprolol 25 mg BID.  Also Lasix 20 mg x 1 dose today.  Will watch BPs today since change in medication and since BP <12 hr back was 150/80 Plan discharge tomorrow if no new concerns and 3-4 days fup with Ob Dr Conni Elliot in office for BP check  Cardiology outpatient f/up with Dr Izora Ribas    --Anna Evans MD

## 2020-10-10 NOTE — Progress Notes (Signed)
Dr. Amado Nash called to verify the schedule of the Metoprolol. Per MD, start at 10pm since the labetalol was given at 1300. Call MD with report on patient after 4pm VS to see if the patient may be discharged.

## 2020-10-11 MED ORDER — IBUPROFEN 600 MG PO TABS
600.0000 mg | ORAL_TABLET | Freq: Four times a day (QID) | ORAL | 0 refills | Status: AC
Start: 2020-10-11 — End: ?

## 2020-10-11 MED ORDER — FERROUS SULFATE 325 (65 FE) MG PO TABS: 325.0000 mg | ORAL_TABLET | Freq: Every day | ORAL | 1 refills | Status: DC

## 2020-10-11 MED ORDER — METOPROLOL TARTRATE 25 MG PO TABS: 25.0000 mg | ORAL_TABLET | Freq: Two times a day (BID) | ORAL | 1 refills | Status: AC

## 2020-10-11 MED ORDER — HYDRALAZINE HCL 50 MG PO TABS: 50.0000 mg | ORAL_TABLET | Freq: Two times a day (BID) | ORAL | 1 refills | Status: DC

## 2020-10-11 NOTE — Progress Notes (Addendum)
  No f/c/s, mild congestion No sob/cp; pain/cramping controlled; nml lochia bottlefeeding   Patient Vitals for the past 24 hrs:  BP Temp Temp src Pulse Resp SpO2  10/11/20 0809 129/77 98 F (36.7 C) Oral 80 17 100 %  10/11/20 0340 (!) 141/83 98.1 F (36.7 C) Oral 77 16 100 %  10/11/20 0010 124/63 98.9 F (37.2 C) Oral (!) 44 18 --  10/10/20 1952 (!) 143/73 98.2 F (36.8 C) Oral (!) 42 20 --  10/10/20 1835 -- -- -- -- -- 100 %  10/10/20 1832 136/70 -- -- (!) 58 20 --  10/10/20 1625 135/80 98 F (36.7 C) Oral 76 -- 100 %  10/10/20 1458 (!) 116/54 98.1 F (36.7 C) Oral (!) 45 17 100 %  10/10/20 1307 (!) 146/86 -- -- 84 18 100 %  10/10/20 1212 (!) 147/79 98 F (36.7 C) Oral (!) 43 17 100 %  10/10/20 1056 121/81 -- -- (!) 42 17 --   A&ox3 Heart:Irreg rhythm, ekg:70s-100 Ctab Abd: soft,nt, nd; fundus firm and below umb LE: +1-2 edema mid-shin and distal, NT   CBC Latest Ref Rng & Units 10/09/2020 10/08/2020 02/05/2014  WBC 4.0 - 10.5 K/uL 14.9(H) 10.0 6.3  Hemoglobin 12.0 - 15.0 g/dL 9.0(L) 12.1 13.2  Hematocrit 36.0 - 46.0 % 26.7(L) 36.9 39.3  Platelets 150 - 400 K/uL 124(L) 138(L) 221   A/P: ppd 3 s/p svd Doing well, d/c home today Mild pre-eclampsia: bps controlled on hydralazine 50mg  bid, also metoprolol 25mg  bid; plan bp f/u in 3-4 days with Dr now nml to low mild range Ventricular bigeminy - cardiology consulted and plans f/u 6-8 wk (Dr ); s/p echo, ef 50% Covid positive - mild congestion Mild acute anemia - iron daily RH pos RI

## 2020-10-11 NOTE — Progress Notes (Signed)
Discharge instructions given, all questions answered, and pt verbalized understanding. RN educated on medications, follow-up appointments for OB and cardiology, and routine care for postpartum vaginal delivery. Also gave [facility] postpartum pamphlet and discussed when to call the MD. Pt is alert and oriented x4, ambulatory, and states pain tolerable for discharge.

## 2020-10-11 NOTE — Plan of Care (Signed)

## 2020-10-11 NOTE — Discharge Summary (Signed)
Postpartum Discharge Summary  Date of Service updated     Patient Name: Anna Grant DOB: Aug 13, 1990 MRN: [id number]  Date of admission: 10/08/2020 Delivery date:10/08/2020  Delivering provider: Clance Boll A  Date of discharge: 10/11/2020  Admitting diagnosis: Uterine contractions [O47.9]; labor Intrauterine pregnancy: [redacted]w[redacted]d     Secondary diagnosis:  Principal Problem:   Postpartum care following vaginal delivery 7/13 Active Problems:   Uterine contractions   SVD (spontaneous vaginal delivery)   Obstetrical laceration - right periurethral   Bradycardia, unspecified   Gestational hypertension  Additional problems: ventricular bigeminy, covid positive; mild pre-eclampsia    Discharge diagnosis: Term Pregnancy Delivered  , ventricular bigeminy, covid positive; mild pre-eclampsia                                           Post partum procedures: echo Augmentation:  none Complications: None  Hospital course: Onset of Labor With Vaginal Delivery      30 y.o. yo G2P1002 at [redacted]w[redacted]d was admitted in Active Labor on 10/08/2020. Patient had an uncomplicated labor course as follows:  Membrane Rupture Time/Date: 9:44 AM ,10/08/2020   Delivery Method:Vaginal, Spontaneous  Episiotomy: None  Lacerations:  Periurethral  On admission pt noted to be covid positive with mild sx.  She also disclosed h/o ventricular bigeminy which had not previously been disclosed, she had not had recent f/u.  Cardiology consulted, echo done and ef 50%.  She is on hydralazine 50mg  bid and metoprolol 25mg  bid to manage mild range bps.  She is ambulating, tolerating a regular diet, passing flatus, and urinating well. Only mild nasal congestion.  Patient is discharged home in stable condition on 10/11/20.  Bottle feeding.  Newborn Data: Birth date:10/08/2020  Birth time:10:27 AM  Gender:Female  Living status:Living  Apgars:8 ,9  Weight:3005 g   Magnesium Sulfate received: No  Physical exam  Vitals:   10/11/20 0010  10/11/20 0340 10/11/20 0809 10/11/20 1000  BP: 124/63 (!) 141/83 129/77 136/78  Pulse: (!) 44 77 80 (!) 42  Resp: 18 16 17 16   Temp: 98.9 F (37.2 C) 98.1 F (36.7 C) 98 F (36.7 C) 97.8 F (36.6 C)  TempSrc: Oral Oral Oral Oral  SpO2:  100% 100% 94%  Weight:      Height:       General: alert, cooperative, and no distress Lochia: appropriate Uterine Fundus: firm Incision: N/A DVT Evaluation: No evidence of DVT seen on physical exam. Labs: Lab Results  Component Value Date   WBC 14.9 (H) 10/09/2020   HGB 9.0 (L) 10/09/2020   HCT 26.7 (L) 10/09/2020   MCV 94.3 10/09/2020   PLT 124 (L) 10/09/2020   CMP Latest Ref Rng & Units 10/08/2020  Glucose 70 - 99 mg/dL 78  BUN 6 - 20 mg/dL 11  Creatinine 10/11/2020 - 10/11/2020 mg/dL 10/10/2020  Sodium 2.35 - 5.73 mmol/L 134(L)  Potassium 3.5 - 5.1 mmol/L 4.2  Chloride 98 - 111 mmol/L 105  CO2 22 - 32 mmol/L 20(L)  Calcium 8.9 - 10.3 mg/dL 2.20)  Total Protein 6.5 - 8.1 g/dL 254)  Total Bilirubin 0.3 - 1.2 mg/dL 0.5  Alkaline Phos 38 - 126 U/L 167(H)  AST 15 - 41 U/L 41  ALT 0 - 44 U/L 30   Edinburgh Score: Edinburgh Postnatal Depression Scale Screening Tool 10/10/2020  I have been able to laugh and see the funny  side of things. 0  I have looked forward with enjoyment to things. 0  I have blamed myself unnecessarily when things went wrong. 2  I have been anxious or worried for no good reason. 1  I have felt scared or panicky for no good reason. 1  Things have been getting on top of me. 1  I have been so unhappy that I have had difficulty sleeping. 0  I have felt sad or miserable. 1  I have been so unhappy that I have been crying. 1  The thought of harming myself has occurred to me. 0  Edinburgh Postnatal Depression Scale Total 7      After visit meds:  Allergies as of 10/11/2020   No Known Allergies      Medication List     TAKE these medications    ferrous sulfate 325 (65 FE) MG tablet Take 1 tablet (325 mg total) by mouth  daily with breakfast.   hydrALAZINE 50 MG tablet Commonly known as: APRESOLINE Take 1 tablet (50 mg total) by mouth 2 (two) times daily.   ibuprofen 600 MG tablet Commonly known as: ADVIL Take 1 tablet (600 mg total) by mouth every 6 (six) hours.   metoprolol tartrate 25 MG tablet Commonly known as: LOPRESSOR Take 1 tablet (25 mg total) by mouth 2 (two) times daily.   prenatal multivitamin Tabs tablet Take 1 tablet by mouth daily at 12 noon.         Discharge home in stable condition Infant Feeding: Bottle Infant Disposition:home with mother Discharge instruction: per After Visit Summary and Postpartum booklet. Activity: Advance as tolerated. Pelvic rest for 6 weeks.  Diet: routine diet Anticipated Birth Control: Unsure Postpartum Appointment:6 wk Additional Postpartum F/U: 10/15/20 for bp check Future Appointments: Future Appointments  Date Time Provider Department Center  11/28/2020  8:00 AM Christell Constant, MD [facility]   Follow up Visit:  Follow-up Information     Christell Constant, MD Follow up.   Specialty: Cardiology Why: Hospital follow-up with Cardiology scheduled for 11/28/2020 at 8am. Please arrive 15 minutes early for check-in. If this date/time does not work for you, please call our office to reschedule. Contact information: [address] [place] [address] [phone]                     10/11/2020 Vick Frees, MD

## 2020-10-21 ENCOUNTER — Telehealth (HOSPITAL_COMMUNITY): Payer: Self-pay | Admitting: *Deleted

## 2020-10-21 NOTE — Telephone Encounter (Signed)
Left message to return nurse call.  Duffy Rhody, RN 10/21/2020 at 10:43am

## 2020-11-27 NOTE — Progress Notes (Signed)
Cardiology Office Note:    Date:  11/28/2020   ID:  Anna Grant, DOB [birth year], MRN [id number]  PCP:  Patient, No Pcp Per (Inactive)   [facility] Providers Cardiologist:  Christell Constant, MD     Referring MD: No ref. provider found   CC: Follow up PVCs  History of Present Illness:    Anna Grant is a 30 y.o. female with a hx of  HTN PVCs (with benign work up in the past in [facility]) who presents in follow up after pregnancy.  Seen 11/28/20.  Patient notes that she is feeling nervous and worried because of the heart.  Since last visit (post partum inpatient) notes taking more medications that  changes.  No chest pain or pressure .  No SOB/DOE and no PND/Orthopnea.  No weight gain or leg swelling.  No palpitations or syncope.  Ambulatory blood pressure not done.   Past Medical History:  Diagnosis Date   Medical history non-contributory    Pes planus of both feet    PVC (premature ventricular contraction)     Past Surgical History:  Procedure Laterality Date   NO PAST SURGERIES      Current Medications: Current Meds  Medication Sig   ibuprofen (ADVIL) 600 MG tablet Take 1 tablet (600 mg total) by mouth every 6 (six) hours.   metoprolol tartrate (LOPRESSOR) 25 MG tablet Take 1 tablet (25 mg total) by mouth 2 (two) times daily.   Prenatal Vit-Fe Fumarate-FA (PRENATAL MULTIVITAMIN) TABS tablet Take 1 tablet by mouth daily at 12 noon.   [DISCONTINUED] hydrALAZINE (APRESOLINE) 50 MG tablet Take 1 tablet (50 mg total) by mouth 2 (two) times daily.     Allergies:   Patient has no known allergies.   Social History   Socioeconomic History   Marital status: Single    Spouse name: Not on file   Number of children: Not on file   Years of education: Not on file   Highest education level: Not on file  Occupational History   Not on file  Tobacco Use   Smoking status: Never    Passive exposure: Never   Smokeless tobacco: Never  Substance and Sexual Activity    Alcohol use: No    Alcohol/week: 0.0 standard drinks   Drug use: No   Sexual activity: Not Currently  Other Topics Concern   Not on file  Social History Narrative   Not on file   Social Determinants of Health   Financial Resource Strain: Not on file  Food Insecurity: Not on file  Transportation Needs: Not on file  Physical Activity: Not on file  Stress: Not on file  Social Connections: Not on file     Family History: History of coronary artery disease notable for no members. History of heart failure notable for no members. History of arrhythmia notable for no members- but mother feels funny sometimes. Grandmother died in her sleep at age of 26, unclear causes.  ROS:   Please see the history of present illness.     All other systems reviewed and are negative.  EKGs/Labs/Other Studies Reviewed:    The following studies were reviewed today:   EKG:  EKG is  ordered today.  The ekg ordered today demonstrates  11/28/20: SR rate 87 with Frequent PVCs  Transthoracic Echocardiogram: Date: 10/10/20 Results:  1. Left ventricular ejection fraction, by estimation, is 50%. The left  ventricle has mildly decreased function. The left ventricle demonstrates  global hypokinesis. Difficult to  quantify LV systolic function given  bigeminal PVCs. Left ventricular  diastolic parameters were normal.   2. Right ventricular systolic function is normal. The right ventricular  size is normal. Tricuspid regurgitation signal is inadequate for assessing  PA pressure.   3. The mitral valve is normal in structure. Mild mitral valve  regurgitation. No evidence of mitral stenosis.   4. The aortic valve is tricuspid.   5. The inferior vena cava is dilated in size with >50% respiratory  variability, suggesting right atrial pressure of 8 mmHg.   6. The patient showed persistent bigeminal PVCs. The    NonCardiac CT : Date:12/10/2015 Results: [facility] CHEST   .  Pulmonary arteries:  Marland Kitchen   Mediastinum/hila: No enlarged or morphologically suspicious mediastinal or hilar lymph nodes.  Marland Kitchen  Heart: Heart size is within normal limits.  .  Lungs: No consolidative airspace disease.  .  Pleura: No pleural effusion. No pneumothorax.   UPPER ABDOMEN:  .  No obvious acute abnormality.   MUSCULOSKELETAL:  .  No acute osseous abnormality.    IMPRESSION:   Negative for pulmonary embolus or infiltrate   NM Stress Testing : Date:11/30/2015 Results: [facility]  ECG  NSR, NSST changes, PVCs  ST segment depression of 0 and 0 mm was noted during stress, beginning at 4 minutes of stress, and returning to baseline after 5-9 minutes of recovery.  No arrhythmias during stress.  No arrhythmias during recovery.  There were no significant arrhythmias noted during the test.  Stress ECG was non diagnostic.  At 4 minutes into recovery noted pt with deep downsloping ST segments in her inferolateral leads. No CP or sx with this. She then began to have PVCs/bijemity again that lasted through the end of her recovery   Nuclear Study Quality  Overall image quality is excellent. There are no artifacts present.   Nuclear Measurements  The left ventricular ejection fraction is 59%. Left ventricle cavity size is normal. The left ventricular function is normal. The wall motion is normal.   Nuclear Stress Study Impression  Left ventricular perfusion is normal.   Recent Labs: 10/08/2020: ALT 30; BUN 11; Creatinine, Ser 0.74; Potassium 4.2; Sodium 134 10/09/2020: Hemoglobin 9.0; Platelets 124  Recent Lipid Panel    Component Value Date/Time   CHOL 182 02/05/2014 2016   TRIG 99 02/05/2014 2016   HDL 55 02/05/2014 2016   CHOLHDL 3.3 02/05/2014 2016   VLDL 20 02/05/2014 2016   LDLCALC 107 (H) 02/05/2014 2016    Physical Exam:    VS:  BP 102/60   Pulse 87   Ht 5\' 3"  (1.6 m)   Wt 138 lb 9.6 oz (62.9 kg)   SpO2 98%   BMI 24.55 kg/m     Wt Readings from Last 3 Encounters:  11/28/20 138 lb 9.6  oz (62.9 kg)  10/08/20 176 lb 1.6 oz (79.9 kg)  02/12/16 109 lb (49.4 kg)     GEN: Well nourished, well developed in no acute distress HEENT: Normal NECK: No JVD LYMPHATICS: No lymphadenopathy CARDIAC: IRIR, no murmurs, rubs, gallops RESPIRATORY:  Clear to auscultation without rales, wheezing or rhonchi  ABDOMEN: Soft, non-tender, non-distended MUSCULOSKELETAL:  No edema; No deformity  SKIN: Warm and dry NEUROLOGIC:  Alert and oriented x 3 PSYCHIATRIC:  Normal affect   ASSESSMENT:    1. Frequent PVCs   2. Essential hypertension    PLAN:    Frequent PVCs HTN - outflow tract origin and asymptomatic - with structurally normal  heart - will assess burden with one week ZioPatch - if symptomatic and not tolerating high BB dose (BP) we will send to EP  - stopping hydralazine (patient notes it was prescribed by cardiology) - starting amb BP and will check in when calling back ZioResults - if BP elevated we can increase metoprolol - provider note:  if concern for bradycardia please do EKG to confirm given frequent PVCs   Will plan for 4-5 months follow up unless new symptoms or abnormal test results warranting change in plan  Would be reasonable for  APP Follow up        Medication Adjustments/Labs and Tests Ordered: Current medicines are reviewed at length with the patient today.  Concerns regarding medicines are outlined above.  Orders Placed This Encounter  Procedures   LONG TERM MONITOR (3-14 DAYS)   EKG 12-Lead    No orders of the defined types were placed in this encounter.   There are no Patient Instructions on file for this visit.   Signed, Christell Constant, MD  11/28/2020 8:46 AM    [facility]

## 2020-11-28 ENCOUNTER — Encounter: Payer: Self-pay | Admitting: Internal Medicine

## 2020-11-28 ENCOUNTER — Ambulatory Visit: Payer: 59

## 2020-11-28 ENCOUNTER — Ambulatory Visit (INDEPENDENT_AMBULATORY_CARE_PROVIDER_SITE_OTHER): Payer: 59 | Admitting: Internal Medicine

## 2020-11-28 ENCOUNTER — Other Ambulatory Visit: Payer: Self-pay

## 2020-11-28 VITALS — BP 102/60 | HR 87 | Ht 63.0 in | Wt 138.6 lb

## 2020-11-28 DIAGNOSIS — I10 Essential (primary) hypertension: Secondary | ICD-10-CM | POA: Diagnosis not present

## 2020-11-28 DIAGNOSIS — I493 Ventricular premature depolarization: Secondary | ICD-10-CM

## 2020-11-28 NOTE — Patient Instructions (Addendum)
Medication Instructions:  Your physician has recommended you make the following change in your medication:  STOP: hydralazine  *If you need a refill on your cardiac medications before your next appointment, please call your pharmacy*   Lab Work: NONE If you have labs (blood work) drawn today and your tests are completely normal, you will receive your results only by: MyChart Message (if you have MyChart) OR A paper copy in the mail If you have any lab test that is abnormal or we need to change your treatment, we will call you to review the results.   Testing/Procedures: Your physician has requested that you wear a 7 day heart monitor.   Please check ambulatory Blood Pressure, your goal is less than 130/90.  Call our office if your BP is not within this range.    Follow-Up: At [facility], you and your health needs are our priority.  As part of our continuing mission to provide you with exceptional heart care, we have created designated Provider Care Teams.  These Care Teams include your primary Cardiologist (physician) and Advanced Practice Providers (APPs -  Physician Assistants and Nurse Practitioners) who all work together to provide you with the care you need, when you need it.   Your next appointment:   4-5 month(s)  The format for your next appointment:   In Person  Provider:   You may see Christell Constant, MD or one of the following Advanced Practice Providers on your designated Care Team:   Ronie Spies, PA-C Jacolyn Reedy, PA-C   Other Instructions Christena Deem- Long Term Monitor Instructions  Your physician has requested you wear a ZIO patch monitor for 7 days.  This is a single patch monitor. [employer] supplies one patch monitor per enrollment. Additional stickers are not available. Please do not apply patch if you will be having a Nuclear Stress Test,  Echocardiogram, Cardiac CT, MRI, or Chest Xray during the period you would be wearing the  monitor. The patch  cannot be worn during these tests. You cannot remove and re-apply the  ZIO XT patch monitor.  Your ZIO patch monitor will be mailed 3 day USPS to your address on file. It may take 3-5 days  to receive your monitor after you have been enrolled.  Once you have received your monitor, please review the enclosed instructions. Your monitor  has already been registered assigning a specific monitor serial # to you.  Billing and Patient Assistance Program Information  We have supplied [employer] with any of your insurance information on file for billing purposes. [employer] offers a sliding scale Patient Assistance Program for patients that do not have  insurance, or whose insurance does not completely cover the cost of the ZIO monitor.  You must apply for the Patient Assistance Program to qualify for this discounted rate.  To apply, please call [employer] at [phone], select option 4, select option 2, ask to apply for  Patient Assistance Program. Meredeth Ide will ask your household income, and how many people  are in your household. They will quote your out-of-pocket cost based on that information.  [employer] will also be able to set up a 20-month, interest-free payment plan if needed.  Applying the monitor   Shave hair from upper left chest.  Hold abrader disc by orange tab. Rub abrader in 40 strokes over the upper left chest as  indicated in your monitor instructions.  Clean area with 4 enclosed alcohol pads. Let dry.  Apply patch as indicated in monitor  instructions. Patch will be placed under collarbone on left  side of chest with arrow pointing upward.  Rub patch adhesive wings for 2 minutes. Remove white label marked "1". Remove the white  label marked "2". Rub patch adhesive wings for 2 additional minutes.  While looking in a mirror, press and release button in center of patch. A small green light will  flash 3-4 times. This will be your only indicator that the monitor has been turned on.  Do not  shower for the first 24 hours. You may shower after the first 24 hours.  Press the button if you feel a symptom. You will hear a small click. Record Date, Time and  Symptom in the Patient Logbook.  When you are ready to remove the patch, follow instructions on the last 2 pages of Patient  Logbook. Stick patch monitor onto the last page of Patient Logbook.  Place Patient Logbook in the blue and white box. Use locking tab on box and tape box closed  securely. The blue and white box has prepaid postage on it. Please place it in the mailbox as  soon as possible. Your physician should have your test results approximately 7 days after the  monitor has been mailed back to [facility].  Call [facility] Customer Care at [phone] if you have questions regarding  your ZIO XT patch monitor. Call them immediately if you see an orange light blinking on your  monitor.  If your monitor falls off in less than 4 days, contact our Monitor department at [phone].  If your monitor becomes loose or falls off after 4 days call [employer] at [phone] for  suggestions on securing your monitor

## 2020-11-28 NOTE — Progress Notes (Unsigned)
Enrolled patient for a 7 day Zio XT monitor to be mailed to patients home.  

## 2020-12-15 ENCOUNTER — Telehealth: Payer: Self-pay | Admitting: Internal Medicine

## 2020-12-15 NOTE — Telephone Encounter (Signed)
Barbara from [place]  called to let our office know that pt's Zio XT Monitor fell off after 48 hours and a new one has already been shipped.

## 2021-04-05 NOTE — Progress Notes (Deleted)
Cardiology Office Note:    Date:  04/05/2021   ID:  Anna Grant, DOB 30-Jun-1990, MRN [id number]  PCP:  No primary care provider on file.   [facility] Providers Cardiologist:  None     Referring MD: No ref. provider found   CC: Follow up PVCs  History of Present Illness:    Anna Grant is a 31 y.o. female with a hx of  HTN PVCs (with benign work up in the past in [facility]) who presents in follow up after pregnancy.  Seen 11/28/20.  We had sent a heart monitor for one week but did not receive it back in follow up.  Patient notes that she is doing ***.   Since day prior/last visit notes *** . There are no*** interval hospital/ED visit.    No chest pain or pressure ***.  No SOB/DOE*** and no PND/Orthopnea***.  No weight gain or leg swelling***.  No palpitations or syncope ***.  Ambulatory blood pressure ***.    Past Medical History:  Diagnosis Date   Medical history non-contributory    Pes planus of both feet    PVC (premature ventricular contraction)     Past Surgical History:  Procedure Laterality Date   NO PAST SURGERIES      Current Medications: No outpatient medications have been marked as taking for the 04/06/21 encounter (Appointment) with Werner Lean, MD.     Allergies:   Patient has no known allergies.   Social History   Socioeconomic History   Marital status: Single    Spouse name: Not on file   Number of children: Not on file   Years of education: Not on file   Highest education level: Not on file  Occupational History   Not on file  Tobacco Use   Smoking status: Never    Passive exposure: Never   Smokeless tobacco: Never  Substance and Sexual Activity   Alcohol use: No    Alcohol/week: 0.0 standard drinks   Drug use: No   Sexual activity: Not Currently  Other Topics Concern   Not on file  Social History Narrative   Not on file   Social Determinants of Health   Financial Resource Strain: Not on file  Food Insecurity:  Not on file  Transportation Needs: Not on file  Physical Activity: Not on file  Stress: Not on file  Social Connections: Not on file     Family History: History of coronary artery disease notable for no members. History of heart failure notable for no members. History of arrhythmia notable for no members- but mother feels funny sometimes. Grandmother died in her sleep at age of 21, unclear causes.  ROS:   Please see the history of present illness.     All other systems reviewed and are negative.  EKGs/Labs/Other Studies Reviewed:    The following studies were reviewed today:   EKG:  EKG is  ordered today.  The ekg ordered today demonstrates  11/28/20: SR rate 87 with Frequent PVCs  Transthoracic Echocardiogram: Date: 10/10/20 Results:  1. Left ventricular ejection fraction, by estimation, is 50%. The left  ventricle has mildly decreased function. The left ventricle demonstrates  global hypokinesis. Difficult to quantify LV systolic function given  bigeminal PVCs. Left ventricular  diastolic parameters were normal.   2. Right ventricular systolic function is normal. The right ventricular  size is normal. Tricuspid regurgitation signal is inadequate for assessing  PA pressure.   3. The mitral valve is normal  in structure. Mild mitral valve  regurgitation. No evidence of mitral stenosis.   4. The aortic valve is tricuspid.   5. The inferior vena cava is dilated in size with >50% respiratory  variability, suggesting right atrial pressure of 8 mmHg.   6. The patient showed persistent bigeminal PVCs. The    NonCardiac CT : Date:12/10/2015 Results: [facility] CHEST   .  Pulmonary arteries:  Marland Kitchen  Mediastinum/hila: No enlarged or morphologically suspicious mediastinal or hilar lymph nodes.  Marland Kitchen  Heart: Heart size is within normal limits.  .  Lungs: No consolidative airspace disease.  .  Pleura: No pleural effusion. No pneumothorax.   UPPER ABDOMEN:  .  No obvious acute  abnormality.   MUSCULOSKELETAL:  .  No acute osseous abnormality.    IMPRESSION:   Negative for pulmonary embolus or infiltrate   NM Stress Testing : Date:11/30/2015 Results: [facility]  ECG  NSR, NSST changes, PVCs  ST segment depression of 0 and 0 mm was noted during stress, beginning at 4 minutes of stress, and returning to baseline after 5-9 minutes of recovery.  No arrhythmias during stress.  No arrhythmias during recovery.  There were no significant arrhythmias noted during the test.  Stress ECG was non diagnostic.  At 4 minutes into recovery noted pt with deep downsloping ST segments in her inferolateral leads. No CP or sx with this. She then began to have PVCs/bijemity again that lasted through the end of her recovery   Nuclear Study Quality  Overall image quality is excellent. There are no artifacts present.   Nuclear Measurements  The left ventricular ejection fraction is 59%. Left ventricle cavity size is normal. The left ventricular function is normal. The wall motion is normal.   Nuclear Stress Study Impression  Left ventricular perfusion is normal.   Recent Labs: 10/08/2020: ALT 30; BUN 11; Creatinine, Ser 0.74; Potassium 4.2; Sodium 134 10/09/2020: Hemoglobin 9.0; Platelets 124  Recent Lipid Panel    Component Value Date/Time   CHOL 182 02/05/2014 2016   TRIG 99 02/05/2014 2016   HDL 55 02/05/2014 2016   CHOLHDL 3.3 02/05/2014 2016   VLDL 20 02/05/2014 2016   LDLCALC 107 (H) 02/05/2014 2016    Physical Exam:    VS:  There were no vitals taken for this visit.    Wt Readings from Last 3 Encounters:  11/28/20 62.9 kg  10/08/20 79.9 kg  02/12/16 49.4 kg     GEN: Well nourished, well developed in no acute distress HEENT: Normal NECK: No JVD LYMPHATICS: No lymphadenopathy CARDIAC: IRIR, no murmurs, rubs, gallops RESPIRATORY:  Clear to auscultation without rales, wheezing or rhonchi  ABDOMEN: Soft, non-tender, non-distended MUSCULOSKELETAL:  No  edema; No deformity  SKIN: Warm and dry NEUROLOGIC:  Alert and oriented x 3 PSYCHIATRIC:  Normal affect   ASSESSMENT:    No diagnosis found.  PLAN:    Frequent PVCs HTN - PRN f/u - +/- monitor        Medication Adjustments/Labs and Tests Ordered: Current medicines are reviewed at length with the patient today.  Concerns regarding medicines are outlined above.  No orders of the defined types were placed in this encounter.   No orders of the defined types were placed in this encounter.    There are no Patient Instructions on file for this visit.   Signed, Werner Lean, MD  04/05/2021 2:49 PM    [place]

## 2021-04-06 ENCOUNTER — Ambulatory Visit: Payer: Medicaid Other | Admitting: Internal Medicine

## 2021-04-06 ENCOUNTER — Encounter: Payer: Self-pay | Admitting: *Deleted

## 2021-04-06 NOTE — Progress Notes (Signed)
Patient ID: Anna Grant, female   DOB: 22-Dec-1990, 31 y.o.   MRN: [id number] Anna Grant has sent out 2 ZIO XT monitors.  Serial # [id number] sent out 11/28/20 fell off within 48 hours.  Replacement monitor serial # [id number] was mailed out 12/15/20.  Neither monitor has been returned to [facility] for processing.  [employer] has marked the monitors status lost, meaning they have attempted to contact the patient 3 times to return the monitor unsuccessfully.

## 2021-12-28 NOTE — Progress Notes (Signed)
Error. Patient was a no show to appointment. 

## 2021-12-29 ENCOUNTER — Encounter: Payer: Medicaid Other | Admitting: Physician Assistant

## 2021-12-29 DIAGNOSIS — I493 Ventricular premature depolarization: Secondary | ICD-10-CM

## 2021-12-29 DIAGNOSIS — I10 Essential (primary) hypertension: Secondary | ICD-10-CM

## 2022-01-20 ENCOUNTER — Telehealth: Payer: Self-pay | Admitting: Internal Medicine

## 2022-01-20 NOTE — Telephone Encounter (Signed)
Pt is moving to [place], [place] and would like a call back to discuss possible referrals for where she is moving to. Please advise.

## 2022-01-20 NOTE — Telephone Encounter (Signed)
Pt called back and gave patient information regarding [facility], at [facility], [address], in [place].  Telephone number provided to their Cardiology office.  Pt stated [facility], closer to home.  Pt may reach out for follow up referral information.

## 2022-03-25 ENCOUNTER — Ambulatory Visit (HOSPITAL_COMMUNITY): Payer: Medicaid Other
